# Patient Record
Sex: Female | Born: 1995 | Marital: Married | Smoking: Never smoker
Health system: Southern US, Community
[De-identification: ages and names within clinical notes are randomized; demographics above are authoritative.]

## PROBLEM LIST (undated history)

## (undated) DIAGNOSIS — O24419 Gestational diabetes mellitus in pregnancy, unspecified control: Secondary | ICD-10-CM

## (undated) HISTORY — PX: NO PAST SURGERIES: SHX2092

## (undated) HISTORY — DX: Gestational diabetes mellitus in pregnancy, unspecified control: O24.419

---

## 2021-03-13 NOTE — L&D Delivery Note (Signed)
OB/GYN Faculty Practice Delivery Note  Sheryl Lopez is a 26 y.o. G2P1001 s/p SVD at [redacted]w[redacted]d. She was admitted for SOL.   ROM: 1h 42m with clear fluid GBS Status: negative Maximum Maternal Temperature: 98.5  Labor Progress: Presented for contractions, found to be 8 cm, she then [facility] and progressed to complete  Delivery Date/Time: 0938 on 6/14 Delivery: Called to room and patient was complete and pushing. Head delivered LOA. No nuchal cord present. Shoulder and body delivered in usual fashion. Infant with spontaneous cry, placed on mother's abdomen, dried and stimulated. Cord clamped x 2 after stopped pulsing (per family preference), and cut by father of baby . Cord blood drawn. Placenta delivered spontaneously with gentle cord traction. Fundus firm with massage and Pitocin. Labia, perineum, vagina, and cervix inspected inspected and found to have a second degree laceration that was repaired with 3-0 vicryl. She also had a mild periurethral abrasion that was hemostatic and therefore was not repaired.  Placenta: intact, 3V cord, to L&D Complications: none Lacerations: 2nd degree EBL: 150cc Analgesia: IV fentanyl and local lidocaine for repair  Infant: female  APGARs 9,9  weight pending  Warner Mccreedy, MD, MPH OB Fellow, Faculty Practice Center for [employer], [facility]

## 2021-05-18 ENCOUNTER — Other Ambulatory Visit: Payer: Self-pay

## 2021-05-18 ENCOUNTER — Ambulatory Visit (INDEPENDENT_AMBULATORY_CARE_PROVIDER_SITE_OTHER): Payer: Medicaid Other | Admitting: Obstetrics and Gynecology

## 2021-05-18 ENCOUNTER — Encounter: Payer: Self-pay | Admitting: Obstetrics and Gynecology

## 2021-05-18 ENCOUNTER — Other Ambulatory Visit (HOSPITAL_COMMUNITY)
Admission: RE | Admit: 2021-05-18 | Discharge: 2021-05-18 | Disposition: A | Discharge status: Home / Self Care | Payer: Medicaid Other | Source: Ambulatory Visit | Attending: Obstetrics and Gynecology | Admitting: Obstetrics and Gynecology

## 2021-05-18 DIAGNOSIS — Z3480 Encounter for supervision of other normal pregnancy, unspecified trimester: Secondary | ICD-10-CM | POA: Insufficient documentation

## 2021-05-18 DIAGNOSIS — Z8632 Personal history of gestational diabetes: Secondary | ICD-10-CM

## 2021-05-18 DIAGNOSIS — O09299 Supervision of pregnancy with other poor reproductive or obstetric history, unspecified trimester: Secondary | ICD-10-CM | POA: Insufficient documentation

## 2021-05-18 DIAGNOSIS — Z3400 Encounter for supervision of normal first pregnancy, unspecified trimester: Secondary | ICD-10-CM

## 2021-05-18 MED ORDER — BLOOD PRESSURE KIT DEVI: 1.0000 | 0 refills | Status: DC

## 2021-05-18 NOTE — Progress Notes (Signed)
Subjective:  ?Sheryl Lopez is a 26 y.o. G2P1001 at [redacted]w[redacted]d being seen today for her first OB visit. New to [place]. EDD by certain LMP. H/O TSVD without problems. Denies any chronic medical problems or medications.    She is currently monitored for the following issues for this low-risk pregnancy and has Supervision of other normal pregnancy, antepartum and History of gestational diabetes on their problem list. ? ?Patient reports no complaints.  Contractions: Not present. Vag. Bleeding: None.  Movement: Present. Denies leaking of fluid.  ? ?The following portions of the patient's history were reviewed and updated as appropriate: allergies, current medications, past family history, past medical history, past social history, past surgical history and problem list. Problem list updated. ? ?Objective:  ? ?Vitals:  ? 05/18/21 1503 05/18/21 1504  ?BP: 130/86   ?Pulse: (!) 102   ?Weight: 155 lb (70.3 kg)   ?Height:  5\' 5"  (1.651 m)  ? ? ?Fetal Status: Fetal Heart Rate (bpm): 159   Movement: Present    ? ?General:  Alert, oriented and cooperative. Patient is in no acute distress.  ?Skin: Skin is warm and dry. No rash noted.   ?Cardiovascular: Normal heart rate noted  ?Respiratory: Normal respiratory effort, no problems with respiration noted  ?Abdomen: Soft, gravid, appropriate for gestational age. Pain/Pressure: Absent     ?Pelvic:  Cervical exam deferred        ?Extremities: Normal range of motion.  Edema: None  ?Mental Status: Normal mood and affect. Normal behavior. Normal judgment and thought content.  ? ?Urinalysis:     ? ?Assessment and Plan:  ?Pregnancy: G2P1001 at [redacted]w[redacted]d ? ?1. Supervision of other normal pregnancy, antepartum ?Prenatal labs and care reviewed with pt ?- CBC/D/Plt+RPR+Rh+ABO+RubIgG... ?- Culture, OB Urine ?- Cervicovaginal ancillary only( [place]) ?- HgB A1c ?- [redacted]w[redacted]d MFM OB COMP + 14 WK; Future ?Reports normal pap smear in 2021, records requested ? ?2. History of gestational diabetes ?A1c today ?Glucola  next visit ? ?Preterm labor symptoms and general obstetric precautions including but not limited to vaginal bleeding, contractions, leaking of fluid and fetal movement were reviewed in detail with the patient. ?Please refer to After Visit Summary for other counseling recommendations.  ?Return in about 2 weeks (around 06/01/2021) for OB visit, face to face, any provider, fasing for Glucola. ? ? ?06/03/2021, MD ?

## 2021-05-18 NOTE — Progress Notes (Addendum)
NOB, Pt declined FLU vaccine and Genetic screening. Late to care. ?Last PAP 2021 ?

## 2021-05-18 NOTE — Patient Instructions (Signed)

## 2021-05-19 LAB — CERVICOVAGINAL ANCILLARY ONLY
Chlamydia: NEGATIVE
Comment: NEGATIVE
Comment: NORMAL
Neisseria Gonorrhea: NEGATIVE

## 2021-05-19 LAB — CBC/D/PLT+RPR+RH+ABO+RUBIGG...
Antibody Screen: NEGATIVE
Basophils Absolute: 0 10*3/uL (ref 0.0–0.2)
Basos: 0 %
EOS (ABSOLUTE): 0 10*3/uL (ref 0.0–0.4)
Eos: 0 %
HCV Ab: NONREACTIVE
HIV Screen 4th Generation wRfx: NONREACTIVE
Hematocrit: 34.3 % (ref 34.0–46.6)
Hemoglobin: 11.7 g/dL (ref 11.1–15.9)
Hepatitis B Surface Ag: NEGATIVE
Immature Grans (Abs): 0 10*3/uL (ref 0.0–0.1)
Immature Granulocytes: 0 %
Lymphocytes Absolute: 1.7 10*3/uL (ref 0.7–3.1)
Lymphs: 19 %
MCH: 32 pg (ref 26.6–33.0)
MCHC: 34.1 g/dL (ref 31.5–35.7)
MCV: 94 fL (ref 79–97)
Monocytes Absolute: 0.5 10*3/uL (ref 0.1–0.9)
Monocytes: 6 %
Neutrophils Absolute: 6.4 10*3/uL (ref 1.4–7.0)
Neutrophils: 75 %
Platelets: 220 10*3/uL (ref 150–450)
RBC: 3.66 x10E6/uL — ABNORMAL LOW (ref 3.77–5.28)
RDW: 11.7 % (ref 11.7–15.4)
RPR Ser Ql: NONREACTIVE
Rh Factor: NEGATIVE
Rubella Antibodies, IGG: 6.83 index (ref >0.99)
WBC: 8.7 10*3/uL (ref 3.4–10.8)

## 2021-05-19 LAB — HEMOGLOBIN A1C
Est. average glucose Bld gHb Est-mCnc: 91 mg/dL
Hgb A1c MFr Bld: 4.8 % (ref 4.8–5.6)

## 2021-05-19 LAB — HCV INTERPRETATION

## 2021-05-20 LAB — CULTURE, OB URINE

## 2021-05-20 LAB — URINE CULTURE, OB REFLEX

## 2021-06-01 ENCOUNTER — Other Ambulatory Visit: Payer: Medicaid Other

## 2021-06-01 ENCOUNTER — Other Ambulatory Visit: Payer: Self-pay

## 2021-06-01 ENCOUNTER — Ambulatory Visit (INDEPENDENT_AMBULATORY_CARE_PROVIDER_SITE_OTHER): Payer: Medicaid Other | Admitting: Obstetrics and Gynecology

## 2021-06-01 VITALS — BP 115/72 | HR 100 | Wt 157.0 lb

## 2021-06-01 DIAGNOSIS — Z6791 Unspecified blood type, Rh negative: Secondary | ICD-10-CM

## 2021-06-01 DIAGNOSIS — O360920 Maternal care for other rhesus isoimmunization, second trimester, not applicable or unspecified: Secondary | ICD-10-CM

## 2021-06-01 DIAGNOSIS — Z3480 Encounter for supervision of other normal pregnancy, unspecified trimester: Secondary | ICD-10-CM

## 2021-06-01 DIAGNOSIS — Z3A27 27 weeks gestation of pregnancy: Secondary | ICD-10-CM | POA: Diagnosis not present

## 2021-06-01 DIAGNOSIS — O26899 Other specified pregnancy related conditions, unspecified trimester: Secondary | ICD-10-CM

## 2021-06-01 DIAGNOSIS — O26892 Other specified pregnancy related conditions, second trimester: Secondary | ICD-10-CM

## 2021-06-01 MED ORDER — RHO D IMMUNE GLOBULIN 1500 UNIT/2ML IJ SOSY
300.0000 ug | PREFILLED_SYRINGE | Freq: Once | INTRAMUSCULAR | Status: AC
Start: 2021-06-01 — End: 2021-06-01
  Administered 2021-06-01: 300 ug via INTRAMUSCULAR

## 2021-06-01 NOTE — Progress Notes (Addendum)
ROB/GTT. Declined TDAP vaccine. ? ?Administrations This Visit   ? ? rho (d) immune globulin (RHIG/RHOPHYLAC) injection 300 mcg   ? ? Admin Date ?06/01/2021 Action ?Given Dose ?300 mcg Route ?Intramuscular Administered By ?Maretta Bees, RMA  ? ?  ?  ? ?  ?  ?

## 2021-06-01 NOTE — Progress Notes (Signed)
? ?  PRENATAL VISIT NOTE ? ?Subjective:  ?Sheryl Lopez is a 26 y.o. G2P1001 at [redacted]w[redacted]d being seen today for ongoing prenatal care.  She is currently monitored for the following issues for this low-risk pregnancy and has Supervision of other normal pregnancy, antepartum; History of gestational diabetes; and Rh negative, antepartum on their problem list. ? ?Patient reports no complaints.  Contractions: Not present. Vag. Bleeding: None.  Movement: Present. Denies leaking of fluid.  ? ?The following portions of the patient's history were reviewed and updated as appropriate: allergies, current medications, past family history, past medical history, past social history, past surgical history and problem list.  ? ?Objective:  ? ?Vitals:  ? 06/01/21 0827  ?BP: 115/72  ?Pulse: 100  ?Weight: 157 lb (71.2 kg)  ? ? ?Fetal Status: Fetal Heart Rate (bpm): 145   Movement: Present    ? ?General:  Alert, oriented and cooperative. Patient is in no acute distress.  ?Skin: Skin is warm and dry. No rash noted.   ?Cardiovascular: Normal heart rate noted  ?Respiratory: Normal respiratory effort, no problems with respiration noted  ?Abdomen: Soft, gravid, appropriate for gestational age.  Pain/Pressure: Absent     ?Pelvic: Cervical exam deferred        ?Extremities: Normal range of motion.  Edema: None  ?Mental Status: Normal mood and affect. Normal behavior. Normal judgment and thought content.  ? ?Assessment and Plan:  ?Pregnancy: G2P1001 at [redacted]w[redacted]d ? ?1. Supervision of other normal pregnancy, antepartum ? ?- Glucose Tolerance, 2 Hours w/1 Hour ?- CBC ?- HIV Antibody (routine testing w rflx) ?- RPR ?- Patient reports negative pap in the last 1-2 years. Records requested again today.  ? ?2. Rh negative, antepartum ? ?- Rhogam given today  ? ?Preterm labor symptoms and general obstetric precautions including but not limited to vaginal bleeding, contractions, leaking of fluid and fetal movement were reviewed in detail with the patient. ?Please  refer to After Visit Summary for other counseling recommendations.  ? ?Return 2-3 weeks, for Please have her sign another request for records. . ? ?Future Appointments  ?Date Time Provider Department Center  ?06/01/2021 10:35 AM Alfonso Shackett, Harolyn Rutherford, NP [facility] None  ?06/08/2021  7:45 AM [facility] US6 [facility]  ? ? ?Venia Carbon, NP  ?

## 2021-06-02 LAB — HIV ANTIBODY (ROUTINE TESTING W REFLEX): HIV Screen 4th Generation wRfx: NONREACTIVE

## 2021-06-02 LAB — CBC
Hematocrit: 34.6 % (ref 34.0–46.6)
Hemoglobin: 11.8 g/dL (ref 11.1–15.9)
MCH: 31.8 pg (ref 26.6–33.0)
MCHC: 34.1 g/dL (ref 31.5–35.7)
MCV: 93 fL (ref 79–97)
Platelets: 233 10*3/uL (ref 150–450)
RBC: 3.71 x10E6/uL — ABNORMAL LOW (ref 3.77–5.28)
RDW: 11.7 % (ref 11.7–15.4)
WBC: 7.6 10*3/uL (ref 3.4–10.8)

## 2021-06-02 LAB — RPR: RPR Ser Ql: NONREACTIVE

## 2021-06-02 LAB — GLUCOSE TOLERANCE, 2 HOURS W/ 1HR
Glucose, 1 hour: 108 mg/dL (ref 70–179)
Glucose, 2 hour: 86 mg/dL (ref 70–152)
Glucose, Fasting: 75 mg/dL (ref 70–91)

## 2021-06-03 ENCOUNTER — Telehealth: Payer: Self-pay

## 2021-06-08 ENCOUNTER — Ambulatory Visit: Payer: Medicaid Other | Admitting: *Deleted

## 2021-06-08 ENCOUNTER — Encounter: Payer: Self-pay | Admitting: *Deleted

## 2021-06-08 ENCOUNTER — Ambulatory Visit: Payer: Medicaid Other | Attending: Obstetrics and Gynecology

## 2021-06-08 ENCOUNTER — Other Ambulatory Visit: Payer: Self-pay | Admitting: *Deleted

## 2021-06-08 ENCOUNTER — Other Ambulatory Visit: Payer: Self-pay

## 2021-06-08 VITALS — BP 122/65 | HR 90

## 2021-06-08 DIAGNOSIS — O0933 Supervision of pregnancy with insufficient antenatal care, third trimester: Secondary | ICD-10-CM | POA: Diagnosis not present

## 2021-06-08 DIAGNOSIS — Z3480 Encounter for supervision of other normal pregnancy, unspecified trimester: Secondary | ICD-10-CM | POA: Diagnosis not present

## 2021-06-08 DIAGNOSIS — Z3A28 28 weeks gestation of pregnancy: Secondary | ICD-10-CM | POA: Diagnosis not present

## 2021-06-08 DIAGNOSIS — Z363 Encounter for antenatal screening for malformations: Secondary | ICD-10-CM | POA: Diagnosis present

## 2021-06-13 ENCOUNTER — Ambulatory Visit: Payer: Medicaid Other

## 2021-06-22 ENCOUNTER — Encounter: Payer: Self-pay | Admitting: Obstetrics and Gynecology

## 2021-06-22 ENCOUNTER — Ambulatory Visit (INDEPENDENT_AMBULATORY_CARE_PROVIDER_SITE_OTHER): Payer: Medicaid Other | Admitting: Obstetrics and Gynecology

## 2021-06-22 VITALS — BP 117/68 | HR 78 | Wt 159.2 lb

## 2021-06-22 DIAGNOSIS — O26899 Other specified pregnancy related conditions, unspecified trimester: Secondary | ICD-10-CM

## 2021-06-22 DIAGNOSIS — Z3480 Encounter for supervision of other normal pregnancy, unspecified trimester: Secondary | ICD-10-CM

## 2021-06-22 DIAGNOSIS — Z6791 Unspecified blood type, Rh negative: Secondary | ICD-10-CM

## 2021-06-22 DIAGNOSIS — Z8632 Personal history of gestational diabetes: Secondary | ICD-10-CM

## 2021-06-22 NOTE — Progress Notes (Signed)
? ?  PRENATAL VISIT NOTE ? ?Subjective:  ?Sheryl Lopez is a 26 y.o. G2P1001 at [redacted]w[redacted]d being seen today for ongoing prenatal care.  She is currently monitored for the following issues for this low-risk pregnancy and has Supervision of other normal pregnancy, antepartum; History of gestational diabetes; and Rh negative, antepartum on their problem list. ? ?Patient reports no complaints.  Contractions: Not present. Vag. Bleeding: None.  Movement: Present. Denies leaking of fluid.  ? ?The following portions of the patient's history were reviewed and updated as appropriate: allergies, current medications, past family history, past medical history, past social history, past surgical history and problem list.  ? ?Objective:  ? ?Vitals:  ? 06/22/21 0958  ?BP: 117/68  ?Pulse: 78  ?Weight: 159 lb 3.2 oz (72.2 kg)  ? ? ?Fetal Status: Fetal Heart Rate (bpm): 153   Movement: Present    ? ?General:  Alert, oriented and cooperative. Patient is in no acute distress.  ?Skin: Skin is warm and dry. No rash noted.   ?Cardiovascular: Normal heart rate noted  ?Respiratory: Normal respiratory effort, no problems with respiration noted  ?Abdomen: Soft, gravid, appropriate for gestational age.  Pain/Pressure: Absent     ?Pelvic: Cervical exam deferred        ?Extremities: Normal range of motion.  Edema: None  ?Mental Status: Normal mood and affect. Normal behavior. Normal judgment and thought content.  ? ?Assessment and Plan:  ?Pregnancy: G2P1001 at [redacted]w[redacted]d ?1. Supervision of other normal pregnancy, antepartum ?- Follow-up in 2 weeks ? ?2. Rh negative, antepartum ?- Received rhogam previous visit ? ?3. History of gestational diabetes ?- Glucose tolerance performed previous visit - Glucose within normal limits  ? ?Preterm labor symptoms and general obstetric precautions including but not limited to vaginal bleeding, contractions, leaking of fluid and fetal movement were reviewed in detail with the patient. ?Please refer to After Visit Summary  for other counseling recommendations.  ? ?Return in about 2 weeks (around 07/06/2021) for OB visit, face to face, any provider. ? ?Future Appointments  ?Date Time Provider Department Center  ?07/06/2021  8:55 AM Brock Bad, MD [facility] None  ?07/07/2021  9:15 AM [facility] NURSE [facility]  ?07/07/2021  9:30 AM [facility] US2 [facility]  ? ? ?Carissa Musick, Medical Student  ?

## 2021-06-22 NOTE — Progress Notes (Signed)
Subjective:  ?Sheryl Lopez is a 26 y.o. G2P1001 at [redacted]w[redacted]d being seen today for ongoing prenatal care.  She is currently monitored for the following issues for this low-risk pregnancy and has Supervision of other normal pregnancy, antepartum; History of gestational diabetes; and Rh negative, antepartum on their problem list. ? ?Patient reports no complaints.  Contractions: Not present. Vag. Bleeding: None.  Movement: Present. Denies leaking of fluid.  ? ?The following portions of the patient's history were reviewed and updated as appropriate: allergies, current medications, past family history, past medical history, past social history, past surgical history and problem list. Problem list updated. ? ?Objective:  ? ?Vitals:  ? 06/22/21 0958  ?BP: 117/68  ?Pulse: 78  ?Weight: 159 lb 3.2 oz (72.2 kg)  ? ? ?Fetal Status: Fetal Heart Rate (bpm): 153   Movement: Present    ? ?General:  Alert, oriented and cooperative. Patient is in no acute distress.  ?Skin: Skin is warm and dry. No rash noted.   ?Cardiovascular: Normal heart rate noted  ?Respiratory: Normal respiratory effort, no problems with respiration noted  ?Abdomen: Soft, gravid, appropriate for gestational age. Pain/Pressure: Absent     ?Pelvic:  Cervical exam deferred        ?Extremities: Normal range of motion.  Edema: None  ?Mental Status: Normal mood and affect. Normal behavior. Normal judgment and thought content.  ? ?Urinalysis:     ? ?Assessment and Plan:  ?Pregnancy: G2P1001 at [redacted]w[redacted]d ? ?1. Supervision of other normal pregnancy, antepartum ?Stable ? ?2. Rh negative, antepartum ?S/P Rhogam ? ?3. History of gestational diabetes ?Normal Glucola ? ?Preterm labor symptoms and general obstetric precautions including but not limited to vaginal bleeding, contractions, leaking of fluid and fetal movement were reviewed in detail with the patient. ?Please refer to After Visit Summary for other counseling recommendations.  ?Return in about 2 weeks (around 07/06/2021) for  OB visit, face to face, any provider. ? ? ?Chancy Milroy, MD ?

## 2021-06-22 NOTE — Patient Instructions (Signed)

## 2021-07-06 ENCOUNTER — Encounter: Payer: Self-pay | Admitting: Obstetrics

## 2021-07-06 ENCOUNTER — Ambulatory Visit (INDEPENDENT_AMBULATORY_CARE_PROVIDER_SITE_OTHER): Payer: Medicaid Other | Admitting: Obstetrics

## 2021-07-06 VITALS — BP 126/64 | HR 113 | Wt 159.4 lb

## 2021-07-06 DIAGNOSIS — Z3A32 32 weeks gestation of pregnancy: Secondary | ICD-10-CM

## 2021-07-06 DIAGNOSIS — Z3480 Encounter for supervision of other normal pregnancy, unspecified trimester: Secondary | ICD-10-CM

## 2021-07-06 NOTE — Progress Notes (Signed)
Subjective:  ?Sheryl Lopez is a 26 y.o. G2P1001 at [redacted]w[redacted]d being seen today for ongoing prenatal care.  She is currently monitored for the following issues for this low-risk pregnancy and has Supervision of other normal pregnancy, antepartum; History of gestational diabetes; and Rh negative, antepartum on their problem list. ? ?Patient reports no complaints.  Contractions: Not present. Vag. Bleeding: None.  Movement: Present. Denies leaking of fluid.  ? ?The following portions of the patient's history were reviewed and updated as appropriate: allergies, current medications, past family history, past medical history, past social history, past surgical history and problem list. Problem list updated. ? ?Objective:  ? ?Vitals:  ? 07/06/21 0917  ?BP: 126/64  ?Pulse: (!) 113  ?Weight: 159 lb 6.4 oz (72.3 kg)  ? ? ?Fetal Status:     Movement: Present    ? ?General:  Alert, oriented and cooperative. Patient is in no acute distress.  ?Skin: Skin is warm and dry. No rash noted.   ?Cardiovascular: Normal heart rate noted  ?Respiratory: Normal respiratory effort, no problems with respiration noted  ?Abdomen: Soft, gravid, appropriate for gestational age. Pain/Pressure: Absent     ?Pelvic:  Cervical exam deferred        ?Extremities: Normal range of motion.  Edema: None  ?Mental Status: Normal mood and affect. Normal behavior. Normal judgment and thought content.  ? ?Urinalysis:     ? ?Assessment and Plan:  ?Pregnancy: G2P1001 at [redacted]w[redacted]d ? ?1. Supervision of other normal pregnancy, antepartum ? ? ?Preterm labor symptoms and general obstetric precautions including but not limited to vaginal bleeding, contractions, leaking of fluid and fetal movement were reviewed in detail with the patient. ?Please refer to After Visit Summary for other counseling recommendations.  ? ?Return in about 2 weeks (around 07/20/2021) for ROB. ? ? ?Brock Bad, MD  ?07/06/21  ?

## 2021-07-07 ENCOUNTER — Other Ambulatory Visit: Payer: Self-pay | Admitting: *Deleted

## 2021-07-07 ENCOUNTER — Ambulatory Visit: Payer: Medicaid Other | Attending: Obstetrics

## 2021-07-07 ENCOUNTER — Encounter: Payer: Self-pay | Admitting: *Deleted

## 2021-07-07 ENCOUNTER — Ambulatory Visit: Payer: Medicaid Other | Admitting: *Deleted

## 2021-07-07 VITALS — BP 115/72 | HR 88

## 2021-07-07 DIAGNOSIS — Z3A33 33 weeks gestation of pregnancy: Secondary | ICD-10-CM

## 2021-07-07 DIAGNOSIS — Z363 Encounter for antenatal screening for malformations: Secondary | ICD-10-CM | POA: Diagnosis not present

## 2021-07-07 DIAGNOSIS — Z362 Encounter for other antenatal screening follow-up: Secondary | ICD-10-CM

## 2021-07-07 DIAGNOSIS — Z3480 Encounter for supervision of other normal pregnancy, unspecified trimester: Secondary | ICD-10-CM

## 2021-07-07 DIAGNOSIS — O0933 Supervision of pregnancy with insufficient antenatal care, third trimester: Secondary | ICD-10-CM | POA: Diagnosis not present

## 2021-07-22 ENCOUNTER — Encounter: Payer: Self-pay | Admitting: Obstetrics and Gynecology

## 2021-07-22 ENCOUNTER — Ambulatory Visit (INDEPENDENT_AMBULATORY_CARE_PROVIDER_SITE_OTHER): Payer: Medicaid Other | Admitting: Obstetrics and Gynecology

## 2021-07-22 VITALS — BP 119/68 | HR 116 | Wt 165.0 lb

## 2021-07-22 DIAGNOSIS — O26899 Other specified pregnancy related conditions, unspecified trimester: Secondary | ICD-10-CM

## 2021-07-22 DIAGNOSIS — Z6791 Unspecified blood type, Rh negative: Secondary | ICD-10-CM

## 2021-07-22 DIAGNOSIS — Z3480 Encounter for supervision of other normal pregnancy, unspecified trimester: Secondary | ICD-10-CM

## 2021-07-22 NOTE — Patient Instructions (Signed)

## 2021-07-22 NOTE — Progress Notes (Signed)
Subjective:  ?Sheryl Lopez is a 26 y.o. G2P1001 at [redacted]w[redacted]d being seen today for ongoing prenatal care.  She is currently monitored for the following issues for this low-risk pregnancy and has Supervision of other normal pregnancy, antepartum; History of gestational diabetes; and Rh negative, antepartum on their problem list. ? ?Patient reports general discomforts of pregnancy.  Contractions: Not present. Vag. Bleeding: None.  Movement: Present. Denies leaking of fluid.  ? ?The following portions of the patient's history were reviewed and updated as appropriate: allergies, current medications, past family history, past medical history, past social history, past surgical history and problem list. Problem list updated. ? ?Objective:  ? ?Vitals:  ? 07/22/21 0907  ?BP: 119/68  ?Pulse: (!) 116  ?Weight: 165 lb (74.8 kg)  ? ? ?Fetal Status: Fetal Heart Rate (bpm): 160   Movement: Present    ? ?General:  Alert, oriented and cooperative. Patient is in no acute distress.  ?Skin: Skin is warm and dry. No rash noted.   ?Cardiovascular: Normal heart rate noted  ?Respiratory: Normal respiratory effort, no problems with respiration noted  ?Abdomen: Soft, gravid, appropriate for gestational age. Pain/Pressure: Absent     ?Pelvic:  Cervical exam deferred        ?Extremities: Normal range of motion.  Edema: Trace  ?Mental Status: Normal mood and affect. Normal behavior. Normal judgment and thought content.  ? ?Urinalysis:     ? ?Assessment and Plan:  ?Pregnancy: G2P1001 at [redacted]w[redacted]d ? ?1. Supervision of other normal pregnancy, antepartum ?Stable ?GBS and vaginal cultures next visit ?Growth scan in 2 weeks ? ?2. Rh negative, antepartum ?S/P Rhogam ? ?Preterm labor symptoms and general obstetric precautions including but not limited to vaginal bleeding, contractions, leaking of fluid and fetal movement were reviewed in detail with the patient. ?Please refer to After Visit Summary for other counseling recommendations.  ?Return in about 1 week  (around 07/29/2021) for OB visit, face to face, any provider. ? ? ?Hermina Staggers, MD ?

## 2021-07-28 ENCOUNTER — Encounter: Payer: Medicaid Other | Admitting: Obstetrics and Gynecology

## 2021-08-05 ENCOUNTER — Ambulatory Visit: Payer: Medicaid Other | Admitting: *Deleted

## 2021-08-05 ENCOUNTER — Ambulatory Visit: Payer: Medicaid Other | Attending: Obstetrics and Gynecology

## 2021-08-05 ENCOUNTER — Other Ambulatory Visit: Payer: Self-pay | Admitting: Obstetrics and Gynecology

## 2021-08-05 VITALS — BP 113/69 | HR 86

## 2021-08-05 DIAGNOSIS — O0933 Supervision of pregnancy with insufficient antenatal care, third trimester: Secondary | ICD-10-CM | POA: Insufficient documentation

## 2021-08-05 DIAGNOSIS — Z3480 Encounter for supervision of other normal pregnancy, unspecified trimester: Secondary | ICD-10-CM | POA: Insufficient documentation

## 2021-08-05 DIAGNOSIS — Z362 Encounter for other antenatal screening follow-up: Secondary | ICD-10-CM

## 2021-08-05 DIAGNOSIS — O368330 Maternal care for abnormalities of the fetal heart rate or rhythm, third trimester, not applicable or unspecified: Secondary | ICD-10-CM | POA: Insufficient documentation

## 2021-08-05 DIAGNOSIS — O09293 Supervision of pregnancy with other poor reproductive or obstetric history, third trimester: Secondary | ICD-10-CM

## 2021-08-05 DIAGNOSIS — Z3A37 37 weeks gestation of pregnancy: Secondary | ICD-10-CM | POA: Diagnosis not present

## 2021-08-05 NOTE — Procedures (Signed)
Sheryl Lopez 27-May-1995 [redacted]w[redacted]d  Fetus A Non-Stress Test Interpretation for 08/05/21  Indication:  fetal tachycardia  Fetal Heart Rate A Mode: External Baseline Rate (A): 145 bpm Variability: Moderate Accelerations: 15 x 15 Decelerations: None Multiple birth?: No  Uterine Activity Mode: Toco Contraction Frequency (min): none Resting Tone Palpated: Relaxed  Interpretation (Fetal Testing) Nonstress Test Interpretation: Reactive Overall Impression: Reassuring for gestational age Comments: tracing reviewed by Dr.Shankar

## 2021-08-23 ENCOUNTER — Other Ambulatory Visit: Payer: Self-pay

## 2021-08-23 ENCOUNTER — Inpatient Hospital Stay (HOSPITAL_COMMUNITY)
Admission: AD | Admit: 2021-08-23 | Discharge: 2021-08-25 | DRG: 807 | Disposition: A | Discharge status: Home / Self Care | Payer: Medicaid Other | Attending: Obstetrics & Gynecology | Admitting: Obstetrics & Gynecology

## 2021-08-23 DIAGNOSIS — Z3A39 39 weeks gestation of pregnancy: Secondary | ICD-10-CM

## 2021-08-23 DIAGNOSIS — O26893 Other specified pregnancy related conditions, third trimester: Principal | ICD-10-CM | POA: Diagnosis present

## 2021-08-23 DIAGNOSIS — Z6791 Unspecified blood type, Rh negative: Secondary | ICD-10-CM

## 2021-08-23 DIAGNOSIS — O26899 Other specified pregnancy related conditions, unspecified trimester: Secondary | ICD-10-CM

## 2021-08-23 DIAGNOSIS — Z3480 Encounter for supervision of other normal pregnancy, unspecified trimester: Principal | ICD-10-CM

## 2021-08-24 ENCOUNTER — Encounter (HOSPITAL_COMMUNITY): Payer: Self-pay | Admitting: Obstetrics & Gynecology

## 2021-08-24 DIAGNOSIS — Z6791 Unspecified blood type, Rh negative: Secondary | ICD-10-CM | POA: Diagnosis not present

## 2021-08-24 DIAGNOSIS — Z3A39 39 weeks gestation of pregnancy: Secondary | ICD-10-CM | POA: Diagnosis not present

## 2021-08-24 DIAGNOSIS — O26893 Other specified pregnancy related conditions, third trimester: Secondary | ICD-10-CM | POA: Diagnosis present

## 2021-08-24 LAB — CBC
HCT: 30.3 % — ABNORMAL LOW (ref 36.0–46.0)
HCT: 30.9 % — ABNORMAL LOW (ref 36.0–46.0)
Hemoglobin: 10.1 g/dL — ABNORMAL LOW (ref 12.0–15.0)
Hemoglobin: 10.1 g/dL — ABNORMAL LOW (ref 12.0–15.0)
MCH: 28.8 pg (ref 26.0–34.0)
MCH: 29.2 pg (ref 26.0–34.0)
MCHC: 32.7 g/dL (ref 30.0–36.0)
MCHC: 33.3 g/dL (ref 30.0–36.0)
MCV: 87.6 fL (ref 80.0–100.0)
MCV: 88 fL (ref 80.0–100.0)
Platelets: 208 10*3/uL (ref 150–400)
Platelets: 218 10*3/uL (ref 150–400)
RBC: 3.46 MIL/uL — ABNORMAL LOW (ref 3.87–5.11)
RBC: 3.51 MIL/uL — ABNORMAL LOW (ref 3.87–5.11)
RDW: 13.3 % (ref 11.5–15.5)
RDW: 13.5 % (ref 11.5–15.5)
WBC: 15.2 10*3/uL — ABNORMAL HIGH (ref 4.0–10.5)
WBC: 9.8 10*3/uL (ref 4.0–10.5)
nRBC: 0 % (ref 0.0–0.2)
nRBC: 0 % (ref 0.0–0.2)

## 2021-08-24 LAB — GROUP B STREP BY PCR: Group B strep by PCR: NEGATIVE

## 2021-08-24 LAB — RPR: RPR Ser Ql: NONREACTIVE

## 2021-08-24 LAB — TYPE AND SCREEN
ABO/RH(D): O NEG
Antibody Screen: POSITIVE

## 2021-08-24 MED ORDER — ONDANSETRON HCL 4 MG/2ML IJ SOLN: 4.0000 mg | INTRAMUSCULAR | Status: DC | PRN

## 2021-08-24 MED ORDER — LIDOCAINE HCL (PF) 1 % IJ SOLN
30.0000 mL | INTRAMUSCULAR | Status: AC | PRN
  Administered 2021-08-24: 30 mL via SUBCUTANEOUS
  Filled 2021-08-24: qty 30

## 2021-08-24 MED ORDER — TETANUS-DIPHTH-ACELL PERTUSSIS 5-2.5-18.5 LF-MCG/0.5 IM SUSY: 0.5000 mL | PREFILLED_SYRINGE | Freq: Once | INTRAMUSCULAR | Status: DC

## 2021-08-24 MED ORDER — FENTANYL CITRATE (PF) 100 MCG/2ML IJ SOLN
INTRAMUSCULAR | Status: AC
  Filled 2021-08-24: qty 2

## 2021-08-24 MED ORDER — FENTANYL CITRATE (PF) 100 MCG/2ML IJ SOLN
50.0000 ug | Freq: Once | INTRAMUSCULAR | Status: AC
  Administered 2021-08-24: 50 ug via INTRAVENOUS

## 2021-08-24 MED ORDER — OXYCODONE-ACETAMINOPHEN 5-325 MG PO TABS: 2.0000 | ORAL_TABLET | ORAL | Status: DC | PRN

## 2021-08-24 MED ORDER — ACETAMINOPHEN 325 MG PO TABS: 650.0000 mg | ORAL_TABLET | ORAL | Status: DC | PRN

## 2021-08-24 MED ORDER — PRENATAL MULTIVITAMIN CH
1.0000 | ORAL_TABLET | Freq: Every day | ORAL | Status: DC
  Filled 2021-08-24: qty 1

## 2021-08-24 MED ORDER — OXYCODONE-ACETAMINOPHEN 5-325 MG PO TABS: 1.0000 | ORAL_TABLET | ORAL | Status: DC | PRN

## 2021-08-24 MED ORDER — ONDANSETRON HCL 4 MG PO TABS: 4.0000 mg | ORAL_TABLET | ORAL | Status: DC | PRN

## 2021-08-24 MED ORDER — SOD CITRATE-CITRIC ACID 500-334 MG/5ML PO SOLN: 30.0000 mL | ORAL | Status: DC | PRN

## 2021-08-24 MED ORDER — SIMETHICONE 80 MG PO CHEW
80.0000 mg | CHEWABLE_TABLET | ORAL | Status: DC | PRN
  Filled 2021-08-24: qty 1

## 2021-08-24 MED ORDER — COCONUT OIL OIL
1.0000 "application " | TOPICAL_OIL | Status: DC | PRN
  Administered 2021-08-25: 1 via TOPICAL

## 2021-08-24 MED ORDER — RHO D IMMUNE GLOBULIN 1500 UNIT/2ML IJ SOSY
300.0000 ug | PREFILLED_SYRINGE | Freq: Once | INTRAMUSCULAR | Status: AC
  Administered 2021-08-24: 300 ug via INTRAVENOUS
  Filled 2021-08-24: qty 2

## 2021-08-24 MED ORDER — LACTATED RINGERS IV SOLN: 500.0000 mL | INTRAVENOUS | Status: DC | PRN

## 2021-08-24 MED ORDER — IBUPROFEN 100 MG/5ML PO SUSP
600.0000 mg | Freq: Four times a day (QID) | ORAL | Status: DC
  Administered 2021-08-24 – 2021-08-25 (×5): 600 mg via ORAL
  Filled 2021-08-24 (×5): qty 30

## 2021-08-24 MED ORDER — BENZOCAINE-MENTHOL 20-0.5 % EX AERO
1.0000 | INHALATION_SPRAY | CUTANEOUS | Status: DC | PRN
Start: 2021-08-24 — End: 2021-08-25
  Filled 2021-08-24: qty 56

## 2021-08-24 MED ORDER — DIPHENHYDRAMINE HCL 25 MG PO CAPS: 25.0000 mg | ORAL_CAPSULE | Freq: Four times a day (QID) | ORAL | Status: DC | PRN

## 2021-08-24 MED ORDER — ONDANSETRON HCL 4 MG/2ML IJ SOLN: 4.0000 mg | Freq: Four times a day (QID) | INTRAMUSCULAR | Status: DC | PRN

## 2021-08-24 MED ORDER — LACTATED RINGERS IV SOLN: INTRAVENOUS | Status: DC

## 2021-08-24 MED ORDER — SENNOSIDES-DOCUSATE SODIUM 8.6-50 MG PO TABS
2.0000 | ORAL_TABLET | Freq: Every day | ORAL | Status: DC
  Filled 2021-08-24: qty 2

## 2021-08-24 MED ORDER — DIBUCAINE (PERIANAL) 1 % EX OINT: 1.0000 "application " | TOPICAL_OINTMENT | CUTANEOUS | Status: DC | PRN

## 2021-08-24 MED ORDER — OXYTOCIN BOLUS FROM INFUSION
333.0000 mL | Freq: Once | INTRAVENOUS | Status: AC
  Administered 2021-08-24: 333 mL via INTRAVENOUS

## 2021-08-24 MED ORDER — MEASLES, MUMPS & RUBELLA VAC IJ SOLR: 0.5000 mL | Freq: Once | INTRAMUSCULAR | Status: DC

## 2021-08-24 MED ORDER — WITCH HAZEL-GLYCERIN EX PADS
1.0000 | MEDICATED_PAD | CUTANEOUS | Status: DC | PRN
Start: 2021-08-24 — End: 2021-08-25

## 2021-08-24 MED ORDER — MEDROXYPROGESTERONE ACETATE 150 MG/ML IM SUSP: 150.0000 mg | INTRAMUSCULAR | Status: DC | PRN

## 2021-08-24 MED ORDER — OXYTOCIN-SODIUM CHLORIDE 30-0.9 UT/500ML-% IV SOLN
2.5000 [IU]/h | INTRAVENOUS | Status: DC
  Filled 2021-08-24: qty 500

## 2021-08-24 MED ORDER — IBUPROFEN 600 MG PO TABS
600.0000 mg | ORAL_TABLET | Freq: Four times a day (QID) | ORAL | Status: DC
  Filled 2021-08-24: qty 1

## 2021-08-24 NOTE — Plan of Care (Signed)
Patient independent of self and baby care. Father of baby is involved and providing good support. Patient hopes that she and her baby are discharged to home tomorrow.

## 2021-08-24 NOTE — H&P (Signed)
OBSTETRIC ADMISSION HISTORY AND PHYSICAL  Sheryl Lopez is a 26 y.o. female G2P1001 with IUP at 49w6dby LMP presenting for SOL. She reports +FMs, No LOF, no VB, no blurry vision, headaches or peripheral edema, and RUQ pain.  She plans on breast feeding. She declines birth control. [facility] --->  Estimated Date of Delivery: 08/25/21  Sono:   _0 , CWD, normal anatomy, cephalic presentation, anterior placental lie, 3317g, 75% EFW   Prenatal History/Complications:  Rh negative - rhogam received 06/01/2021  Past Medical History: Past Medical History:  Diagnosis Date   Gestational diabetes     Past Surgical History: Past Surgical History:  Procedure Laterality Date   NO PAST SURGERIES      Obstetrical History: OB History     Gravida  2   Para  1   Term  1   Preterm      AB      Living  1      SAB      IAB      Ectopic      Multiple      Live Births  1           Social History Social History   Socioeconomic History   Marital status: Married    Spouse name: JMartinique  Number of children: 1   Years of education: Not on file   Highest education level: Not on file  Occupational History   Not on file  Tobacco Use   Smoking status: Never   Smokeless tobacco: Never  Vaping Use   Vaping Use: Never used  Substance and Sexual Activity   Alcohol use: Never   Drug use: Never   Sexual activity: Yes    Birth control/protection: None  Other Topics Concern   Not on file  Social History Narrative   Not on file   Social Determinants of Health   Financial Resource Strain: Not on file  Food Insecurity: Not on file  Transportation Needs: Not on file  Physical Activity: Not on file  Stress: Not on file  Social Connections: Not on file    Family History: Family History  Problem Relation Age of Onset   Cancer Maternal Grandmother    Diabetes Maternal Grandfather    Asthma Neg Hx    Heart disease Neg  Hx    Hypertension Neg Hx    Stroke Neg Hx     Allergies: No Known Allergies  Medications Prior to Admission  Medication Sig Dispense Refill Last Dose   Prenatal Vit-Fe Fumarate-FA (PRENATAL MULTIVITAMIN) TABS tablet Take 1 tablet by mouth daily at 12 noon.   08/23/2021   Blood Pressure Monitoring (BLOOD PRESSURE KIT) DEVI 1 kit by Does not apply route once a week. Check Blood Pressure regularly and record readings into the [employer] App.  Large Cuff.  DX O90.0 (Patient not taking: Reported on 07/06/2021) 1 each 0      Review of Systems   All systems reviewed and negative except as stated in HPI  Blood pressure 133/75, pulse 87, temperature 98.5 F (36.9 C), resp. rate 16, height _1  (1.651 m), weight 79.4 kg, last menstrual period 11/18/2020, SpO2 99 %. General appearance: alert Lungs: clear to auscultation bilaterally Heart: regular rate and rhythm Abdomen: soft, non-tender; bowel sounds normal Extremities: Homans sign is negative, no sign of DVT Presentation: cephalic Fetal monitoringBaseline: 135 bpm, Variability: Good {> 6 bpm), Accelerations:  no accels yet but only monitored for 10 min so far, and Decelerations: Absent Uterine activity every 3-5 Dilation: 8 Effacement (%): 90 Station: 0 Exam by:: Francee Gentile, RN   Prenatal labs: ABO, Rh: --/--/PENDING (06/14 0024) Antibody: PENDING (06/14 0024) Rubella: 6.83 (03/08 1527) RPR: Non Reactive (03/22 0945)  HBsAg: Negative (03/08 1527)  HIV: Non Reactive (03/22 0945)  GBS:   unknown (missed appt for swab) 2 hr Glucola normal Genetic screening  declined Anatomy US normal  Prenatal Transfer Tool  Maternal Diabetes: No Genetic Screening: Declined Maternal Ultrasounds/Referrals: Normal Fetal Ultrasounds or other Referrals:  None Maternal Substance Abuse:  No Significant Maternal Medications:  None Significant Maternal Lab Results: Other: GBS unk  Results for orders placed or performed during the hospital  encounter of 08/23/21 (from the past 24 hour(s))  CBC   Collection Time: 08/24/21 12:24 AM  Result Value Ref Range   WBC 9.8 4.0 - 10.5 K/uL   RBC 3.51 (L) 3.87 - 5.11 MIL/uL   Hemoglobin 10.1 (L) 12.0 - 15.0 g/dL   HCT 30.9 (L) 36.0 - 46.0 %   MCV 88.0 80.0 - 100.0 fL   MCH 28.8 26.0 - 34.0 pg   MCHC 32.7 30.0 - 36.0 g/dL   RDW 13.5 11.5 - 15.5 %   Platelets 218 150 - 400 K/uL   nRBC 0.0 0.0 - 0.2 %  Type and screen [place]   Collection Time: 08/24/21 12:24 AM  Result Value Ref Range   ABO/RH(D) PENDING    Antibody Screen PENDING    Sample Expiration      08/27/2021,2359 Performed at [facility], [address], [place], [place] [address]     Patient Active Problem List   Diagnosis Date Noted   Normal labor 08/24/2021   Rh negative, antepartum 06/01/2021   Supervision of other normal pregnancy, antepartum 05/18/2021   History of gestational diabetes 05/18/2021    Assessment/Plan:  Sheryl Lopez is a 26 y.o. G2P1001 at 50w6dhere for spontaneous labor  #Labor:Presented at 8cm.  Will plan for expectant management at this time. Offered AROM but patient would like to hold off #Pain: unmedicated #FWB: Cat I #ID:  GBS unk, PCR sent. Will hold off on treating given patient is term #MOF: breast #MOC: declines #Circ:  unsure  #Rh negative Received rhogam on 3/22. Plan for rhogam workup PP  ARenard Matter MD, MPH OB Fellow, Faculty Practice

## 2021-08-24 NOTE — Discharge Summary (Signed)
Postpartum Discharge Summary     Patient Name: Sheryl Lopez DOB: [birth year] MRN: [id number]  Date of admission: 08/23/2021 Delivery date:08/24/2021  Delivering provider: Renard Matter  Date of discharge: 08/25/2021  Admitting diagnosis: Normal labor [O80, Z37.9] Intrauterine pregnancy: [redacted]w[redacted]d    Secondary diagnosis:  Principal Problem:   Vaginal delivery Active Problems:   Supervision of other normal pregnancy, antepartum   Rh negative, antepartum   Normal labor  Additional problems: None    Discharge diagnosis: Term Pregnancy Delivered                                              Post partum procedures: None Augmentation: N/A Complications: None  Hospital course: Onset of Labor With Vaginal Delivery      26y.o. yo G2P1001 at 361w6das admitted in Active Labor on 08/23/2021. Patient had an uncomplicated labor course as follows:  Membrane Rupture Time/Date: 1:48 AM ,08/24/2021   Delivery Method:Vaginal, Spontaneous  Episiotomy: None  Lacerations:  2nd degree  Patient had an uncomplicated postpartum course.  She is ambulating, tolerating a regular diet, passing flatus, and urinating well.  Her pain and bleeding are controlled.  She is breastfeeding well.  Patient is discharged home in stable condition on 08/25/21.  Newborn Data: Birth date:08/24/2021  Birth time:3:07 AM  Gender:Female  Living status:Living  Apgars:9 ,9  Weight:3920 g   Magnesium Sulfate received: No BMZ received: No Rhophylac: Given postpartum  MMR: N/A T-DaP: Declined Flu: No Transfusion: No  Physical exam  Vitals:   08/24/21 1452 08/24/21 1845 08/24/21 2137 08/25/21 0537  BP: 116/61 116/77 116/70 120/75  Pulse: 91 97 94 96  Resp: _0 Temp: 98.3 F (36.8 C) 98.5 F (36.9 C) 98.2 F (36.8 C) 98.6 F (37 C)  TempSrc: Oral Oral Oral   SpO2: 100% 97% 99%   Weight:      Height:       General: alert, cooperative, and no distress Lochia: appropriate Uterine Fundus: firm and below  umbilicus  DVT Evaluation: no LE edema or calf tenderness to palpation   Labs: Lab Results  Component Value Date   WBC 15.2 (H) 08/24/2021   HGB 10.1 (L) 08/24/2021   HCT 30.3 (L) 08/24/2021   MCV 87.6 08/24/2021   PLT 208 08/24/2021       No data to display         Edinburgh Score:    08/24/2021    1:04 PM  Edinburgh Postnatal Depression Scale Screening Tool  I have been able to laugh and see the funny side of things. 0  I have looked forward with enjoyment to things. 0  I have blamed myself unnecessarily when things went wrong. 0  I have been anxious or worried for no good reason. 0  I have felt scared or panicky for no good reason. 0  Things have been getting on top of me. 1  I have been so unhappy that I have had difficulty sleeping. 0  I have felt sad or miserable. 0  I have been so unhappy that I have been crying. 0  The thought of harming myself has occurred to me. 0  Edinburgh Postnatal Depression Scale Total 1     After visit meds:  Allergies as of 08/25/2021   No Known Allergies      Medication  List     TAKE these medications    acetaminophen 500 MG tablet Commonly known as: TYLENOL Take 2 tablets (1,000 mg total) by mouth every 8 (eight) hours as needed (pain).   Blood Pressure Kit Devi 1 kit by Does not apply route once a week. Check Blood Pressure regularly and record readings into the Babyscripts App.  Large Cuff.  DX O90.0   ibuprofen 100 MG/5ML suspension Commonly known as: ADVIL Take 30 mLs (600 mg total) by mouth every 6 (six) hours.   prenatal multivitamin Tabs tablet Take 1 tablet by mouth daily after breakfast.         Discharge home in stable condition Infant Feeding: Breast Infant Disposition: home with mother Discharge instruction: per After Visit Summary and Postpartum booklet. Activity: Advance as tolerated. Pelvic rest for 6 weeks.  Diet: routine diet Future Appointments: Future Appointments  Date Time Provider  Department Center  09/29/2021  1:10 PM Forlan, Nicole, NP [facility] None   Follow up Visit: Message sent to [facility] by Dr. Das on 6/14  Please schedule this patient for a In person postpartum visit in 4 weeks with the following provider: Any provider. Additional Postpartum F/U: None   Low risk pregnancy complicated by:  None Delivery mode:  Vaginal, Spontaneous  Anticipated Birth Control: Declined  08/25/2021 Christina M Albert, MD    

## 2021-08-24 NOTE — MAU Note (Signed)
.  Sheryl Lopez is a 26 y.o. at [redacted]w[redacted]d here in MAU reporting ctxs since 2030. Denies LOF or VB. Good FM. No recent sve  Onset of complaint: 2030 Pain score: 6 Vitals:   08/24/21 0002  Pulse: 77  Resp: 16  Temp: 98.5 F (36.9 C)  SpO2: 99%     FHT:130 Lab orders placed from triage: mau labor eval orders

## 2021-08-25 ENCOUNTER — Inpatient Hospital Stay (HOSPITAL_COMMUNITY): Admit: 2021-08-25 | Payer: Self-pay

## 2021-08-25 LAB — RH IG WORKUP (INCLUDES ABO/RH)
Fetal Screen: NEGATIVE
Gestational Age(Wks): 39
Unit division: 0

## 2021-08-25 LAB — GC/CHLAMYDIA PROBE AMP (~~LOC~~) NOT AT ARMC
Chlamydia: NEGATIVE
Comment: NEGATIVE
Comment: NORMAL
Neisseria Gonorrhea: NEGATIVE

## 2021-08-25 LAB — BIRTH TISSUE RECOVERY COLLECTION (PLACENTA DONATION)

## 2021-08-25 MED ORDER — ACETAMINOPHEN 500 MG PO TABS: 1000.0000 mg | ORAL_TABLET | Freq: Three times a day (TID) | ORAL | 0 refills | Status: DC | PRN

## 2021-08-25 MED ORDER — IBUPROFEN 100 MG/5ML PO SUSP: 600.0000 mg | Freq: Four times a day (QID) | ORAL | 1 refills | Status: DC

## 2021-09-01 ENCOUNTER — Telehealth (HOSPITAL_COMMUNITY): Payer: Self-pay | Admitting: *Deleted

## 2021-09-01 NOTE — Telephone Encounter (Signed)
Left phone voicemail message.  Duffy Rhody, RN 09-01-2021 at 1:57pm

## 2021-09-29 ENCOUNTER — Ambulatory Visit: Payer: Medicaid Other | Admitting: Student

## 2023-02-01 ENCOUNTER — Ambulatory Visit: Payer: Medicaid Other | Admitting: *Deleted

## 2023-02-01 VITALS — BP 118/65 | HR 86 | Wt 138.6 lb

## 2023-02-01 DIAGNOSIS — O0930 Supervision of pregnancy with insufficient antenatal care, unspecified trimester: Secondary | ICD-10-CM | POA: Insufficient documentation

## 2023-02-01 DIAGNOSIS — O0932 Supervision of pregnancy with insufficient antenatal care, second trimester: Secondary | ICD-10-CM

## 2023-02-01 DIAGNOSIS — Z1339 Encounter for screening examination for other mental health and behavioral disorders: Secondary | ICD-10-CM

## 2023-02-01 DIAGNOSIS — Z3482 Encounter for supervision of other normal pregnancy, second trimester: Secondary | ICD-10-CM

## 2023-02-01 DIAGNOSIS — Z3480 Encounter for supervision of other normal pregnancy, unspecified trimester: Secondary | ICD-10-CM

## 2023-02-01 DIAGNOSIS — Z3A21 21 weeks gestation of pregnancy: Secondary | ICD-10-CM

## 2023-02-01 NOTE — Progress Notes (Signed)
Late to care. Informal bedside US today shows single fetus. Cardiac activity and fetal movement observed. FHR 140s. Appearance of fetus and informal FL and HC consistent with LMP provided. Formal measurements deferred per MFM request.

## 2023-02-01 NOTE — Progress Notes (Signed)
New OB Intake  I connected with Sheryl Lopez  on 02/01/23 at  3:10 PM EST by In Person Visit and verified that I am speaking with the correct person using two identifiers. Nurse is located at [facility] and pt is located at [place].  I discussed the limitations, risks, security and privacy concerns of performing an evaluation and management service by telephone and the availability of in person appointments. I also discussed with the patient that there may be a patient responsible charge related to this service. The patient expressed understanding and agreed to proceed.  I explained I am completing New OB Intake today. We discussed EDD of Not found.. Pt is [id number]. I reviewed her allergies, medications and Medical/Surgical/OB history.    Patient Active Problem List   Diagnosis Date Noted   Normal labor 08/24/2021   Vaginal delivery 08/24/2021   Rh negative, antepartum 06/01/2021   Supervision of other normal pregnancy, antepartum 05/18/2021   History of gestational diabetes 05/18/2021    Concerns addressed today  Delivery Plans Plans to deliver at [facility]. Discussed the nature of our practice with multiple providers including residents and students. Due to the size of the practice, the delivering provider may not be the same as those providing prenatal care.   Patient is not interested in water birth. Offered upcoming OB visit with CNM to discuss further.  MyChart/Babyscripts MyChart access verified. I explained pt will have some visits in office and some virtually. Babyscripts instructions given and order placed. Patient verifies receipt of registration text/e-mail. Account successfully created and app downloaded.  Blood Pressure Cuff/Weight Scale Blood pressure cuff ordered for patient to pick-up from [employer]. Explained after first prenatal appt pt will check weekly and document in Babyscripts. Patient does not have weight scale; patient may purchase if they desire to track  weight weekly in Babyscripts.  Anatomy US Explained first scheduled [place] will be around 19 weeks. Anatomy US scheduled for TBD at TBD.  Interested in [place]? If yes, send referral and doula dot phrase.   Is patient a candidate for Babyscripts Optimization? Yes  First visit review I reviewed new OB appt with patient. Explained pt will be seen by Raelyn Mora, CNM at first visit. Discussed Avelina Laine genetic screening with patient. Declined Panorama and Horizon.. Routine prenatal labs  deferred to New OB appt per pt request.    Last Pap No results found for: "DIAGPAP"  Harrel Lemon, RN 02/01/2023  3:09 PM

## 2023-02-01 NOTE — Patient Instructions (Signed)

## 2023-02-14 ENCOUNTER — Encounter: Payer: Self-pay | Admitting: Obstetrics and Gynecology

## 2023-02-14 ENCOUNTER — Other Ambulatory Visit (HOSPITAL_COMMUNITY)
Admission: RE | Admit: 2023-02-14 | Discharge: 2023-02-14 | Disposition: A | Discharge status: Home / Self Care | Payer: Medicaid Other | Source: Ambulatory Visit | Attending: Obstetrics and Gynecology | Admitting: Obstetrics and Gynecology

## 2023-02-14 ENCOUNTER — Ambulatory Visit (INDEPENDENT_AMBULATORY_CARE_PROVIDER_SITE_OTHER): Payer: Medicaid Other | Admitting: Obstetrics and Gynecology

## 2023-02-14 ENCOUNTER — Other Ambulatory Visit: Payer: Medicaid Other

## 2023-02-14 VITALS — BP 117/65 | HR 91 | Wt 142.0 lb

## 2023-02-14 DIAGNOSIS — Z3480 Encounter for supervision of other normal pregnancy, unspecified trimester: Secondary | ICD-10-CM | POA: Insufficient documentation

## 2023-02-14 DIAGNOSIS — Z3A23 23 weeks gestation of pregnancy: Secondary | ICD-10-CM

## 2023-02-14 DIAGNOSIS — O0932 Supervision of pregnancy with insufficient antenatal care, second trimester: Secondary | ICD-10-CM | POA: Diagnosis not present

## 2023-02-14 DIAGNOSIS — Z3482 Encounter for supervision of other normal pregnancy, second trimester: Secondary | ICD-10-CM

## 2023-02-14 NOTE — Progress Notes (Signed)
INITIAL OBSTETRICAL VISIT Patient name: Sheryl Lopez MRN [id number]  Date of birth: Aug 03, 1995 Chief Complaint:   Initial Prenatal Visit  History of Present Illness:   Sheryl Lopez is a 27 y.o. G70P2002 Caucasian female at [redacted]w[redacted]d by LMP with an Estimated Date of Delivery: 06/12/23 being seen today for her initial obstetrical visit.  Her obstetrical history is significant for  Rh Neg, h/o A1GDM with 1st pregnancy, SVD x 2 (2021 & 2023), and late [facility] with current pregnancy . This is a planned pregnancy. She and the father of the baby (FOB) her spouse live together. She has a support system that consists of her spouse/her family/friends. Today she reports no complaints.   Patient's last menstrual period was 09/05/2022. Last pap 2021. Results were: normal Review of Systems:   Pertinent items are noted in HPI Denies cramping/contractions, leakage of fluid, vaginal bleeding, abnormal vaginal discharge w/ itching/odor/irritation, headaches, visual changes, shortness of breath, chest pain, abdominal pain, severe nausea/vomiting, or problems with urination or bowel movements unless otherwise stated above.  Pertinent History Reviewed:  Reviewed past medical,surgical, social, obstetrical and family history.  Reviewed problem list, medications and allergies. OB History  Gravida Para Term Preterm AB Living  3 2 2    0 2  SAB IAB Ectopic Multiple Live Births  0 0 0 0 2    # Outcome Date GA Lbr Len/2nd Weight Sex Type Anes PTL Lv  3 Current           2 Term 08/24/21 102w6d 06:31 / 00:06 8 lb 10.3 oz (3.92 kg) M Vag-Spont Local  LIV  1 Term 08/05/19 [redacted]w[redacted]d  8 lb (3.629 kg) F Vag-Spont  N LIV   Physical Assessment:   Vitals:   02/14/23 1003  BP: 117/65  Pulse: 91  Weight: 142 lb (64.4 kg)  Body mass index is 23.63 kg/m.       Physical Examination:  General appearance - well appearing, and in no distress  Mental status - alert, oriented to person, place, and time  Psych:  She has a normal mood and  affect  Skin - warm and dry, normal color, no suspicious lesions noted  Chest - effort normal, all lung fields clear to auscultation bilaterally  Heart - normal rate and regular rhythm  Abdomen - soft, nontender  Extremities:  No swelling or varicosities noted  Pelvic - VULVA: normal appearing vulva with no masses, tenderness or lesions  VAGINA: normal appearing vagina with normal color and discharge, no lesions.   CERVIX: normal appearing cervix without discharge or lesions, no CMT  Thin prep pap is not done (patient declines to have it done today)   FHTs by doppler: 161 bpm  Assessment & Plan:  1) Low-Risk Pregnancy G3P2002 at [redacted]w[redacted]d with an Estimated Date of Delivery: 06/12/23   2) Initial OB visit - Welcomed to practice and introduced self to patient in addition to discussing other advanced practice providers that she may be seeing at this practice - Congratulated patient - Anticipatory guidance on upcoming appointments - Educated on COVID19 and pregnancy and the integration of virtual appointments  - Educated on babyscripts app- patient reports she has not received email, encouraged to look in spam folder and to call office if she still has not received email - patient verbalizes understanding    3) Supervision of other normal pregnancy, antepartum - Glucose Tolerance, 2 Hours w/1 Hour>> NO need to rpt at 28 wks - CBC/D/Plt+RPR+Rh+ABO+RubIgG... - Culture, OB Urine - Cervicovaginal ancillary only(  [place]) - Anatomy U/S scheduled for 03/16/2023 - Patient would like to defer pap until PP visit  4) Encounter for supervision of other normal pregnancy in second trimester - HORIZON Basic Panel - PANORAMA PRENATAL TEST    Meds: No orders of the defined types were placed in this encounter.   Initial labs obtained Continue prenatal vitamins Reviewed n/v relief measures and warning s/s to report Reviewed recommended weight gain based on pre-gravid BMI Encouraged well-balanced  diet Genetic Screening discussed: ordered Cystic fibrosis, SMA, Fragile X screening discussed ordered The nature of [place] [facility] with multiple MDs and other Advanced Practice Providers was explained to patient; also emphasized that residents, students are part of our team.  Discussed optimized OB schedule and video visits. Advised can have an in-office visit whenever she feels she needs to be seen.  Does have BP cuff. Check BP weekly, report BP >140/90. Advised to call during normal business hours and there is an after-hours nurse line available.   Indications for ASA therapy (per uptodate) One of the following: Previous pregnancy with preeclampsia, especially early onset and with an adverse outcome? no Multifetal gestation? no Chronic hypertension? no Type 1 or 2 diabetes mellitus? no Chronic kidney disease? no Autoimmune disease (antiphospholipid syndrome, systemic lupus erythematosus)? no   Two or more of the following: Nulliparity? no Obesity (body mass index >30 kg/m2)? no Family history of preeclampsia in mother or sister? no Age >=35 years? no Sociodemographic characteristics (African American race, low socioeconomic level)? no Personal risk factors (eg, previous pregnancy with low birth weight or small for gestational age infant, previous adverse pregnancy outcome [eg, stillbirth], interval >10 years between pregnancies)? no   Indications for early GDM screening  First-degree relative with diabetes? no BMI >30kg/m2? no Age > 35 years? no Previous birth of an infant weighing >=4000 g? no Gestational diabetes mellitus in a previous pregnancy? yes Glycated hemoglobin >=5.7 percent (39 mmol/mol), impaired glucose tolerance, or impaired fasting glucose on previous testing? no High-risk race/ethnicity (eg, African [address], Latino, Native [address], [place] American, [place] Islander)? no Previous stillbirth of unknown cause? no Maternal birthweight  > 9 lbs? no History of cardiovascular disease? no Hypertension or on therapy for hypertension? no High-density lipoprotein cholesterol level <35 mg/dL (4.09 mmol/L) and/or a triglyceride level >250 mg/dL (8.11 mmol/L)? no Polycystic ovarian syndrome (PCOS)? no Physical inactivity? no Other clinical condition associated with insulin resistance (eg, severe obesity, acanthosis nigricans)? no Current use of glucocorticoids? no  Follow-up: Return in about 4 weeks (around 03/14/2023) for Return OB visit.   Orders Placed This Encounter  Procedures   Culture, OB Urine   Glucose Tolerance, 2 Hours w/1 Hour   HORIZON Basic Panel   CBC/D/Plt+RPR+Rh+ABO+RubIgG...   PANORAMA PRENATAL TEST    Raelyn Mora MSN, CNM 02/14/2023 1:19 PM

## 2023-02-14 NOTE — Progress Notes (Signed)
Pt presents for NOB visit. Pt reports last PAP 2021, no Hx of abnormal PAP. Wants to defer PAP PP

## 2023-02-15 LAB — CBC/D/PLT+RPR+RH+ABO+RUBIGG...
Antibody Screen: NEGATIVE
Basophils Absolute: 0 10*3/uL (ref 0.0–0.2)
Basos: 1 %
EOS (ABSOLUTE): 0.1 10*3/uL (ref 0.0–0.4)
Eos: 1 %
HCV Ab: NONREACTIVE
HIV Screen 4th Generation wRfx: NONREACTIVE
Hematocrit: 32.4 % — ABNORMAL LOW (ref 34.0–46.6)
Hemoglobin: 10.8 g/dL — ABNORMAL LOW (ref 11.1–15.9)
Hepatitis B Surface Ag: NEGATIVE
Immature Grans (Abs): 0 10*3/uL (ref 0.0–0.1)
Immature Granulocytes: 0 %
Lymphocytes Absolute: 1.3 10*3/uL (ref 0.7–3.1)
Lymphs: 21 %
MCH: 31.8 pg (ref 26.6–33.0)
MCHC: 33.3 g/dL (ref 31.5–35.7)
MCV: 95 fL (ref 79–97)
Monocytes Absolute: 0.3 10*3/uL (ref 0.1–0.9)
Monocytes: 5 %
Neutrophils Absolute: 4.6 10*3/uL (ref 1.4–7.0)
Neutrophils: 72 %
Platelets: 222 10*3/uL (ref 150–450)
RBC: 3.4 x10E6/uL — ABNORMAL LOW (ref 3.77–5.28)
RDW: 11.9 % (ref 11.7–15.4)
RPR Ser Ql: NONREACTIVE
Rh Factor: NEGATIVE
Rubella Antibodies, IGG: 3.89 {index} (ref >0.99)
WBC: 6.3 10*3/uL (ref 3.4–10.8)

## 2023-02-15 LAB — HCV INTERPRETATION

## 2023-02-15 LAB — GLUCOSE TOLERANCE, 2 HOURS W/ 1HR
Glucose, 1 hour: 94 mg/dL (ref 70–179)
Glucose, 2 hour: 87 mg/dL (ref 70–152)
Glucose, Fasting: 59 mg/dL — ABNORMAL LOW (ref 70–91)

## 2023-02-16 LAB — CERVICOVAGINAL ANCILLARY ONLY
Chlamydia: NEGATIVE
Comment: NEGATIVE
Comment: NEGATIVE
Comment: NORMAL
Neisseria Gonorrhea: NEGATIVE
Trichomonas: NEGATIVE

## 2023-02-16 LAB — CULTURE, OB URINE

## 2023-02-16 LAB — URINE CULTURE, OB REFLEX

## 2023-02-21 ENCOUNTER — Encounter: Payer: Self-pay | Admitting: Obstetrics and Gynecology

## 2023-02-21 LAB — PANORAMA PRENATAL TEST FULL PANEL:PANORAMA TEST PLUS 5 ADDITIONAL MICRODELETIONS: FETAL FRACTION: 21.1

## 2023-02-21 LAB — HORIZON CUSTOM: REPORT SUMMARY: NEGATIVE

## 2023-03-14 NOTE — L&D Delivery Note (Signed)
 OB/GYN Faculty Practice Delivery Note  Sheryl Lopez is a 28 y.o. G3 now P3003 s/p SVD at [redacted]w[redacted]d. She was admitted for labor.   ROM: 0h 59m with meconium-stained fluid GBS Status: unknown, patient declines swab   Maximum Maternal Temperature: 97.7F  Labor Progress: Initial SVE 7/90/-1 SROM with meconium at 02:18 Patient then progressed to complete  Delivery Date/Time: 06/14/2023 at 02:30 Delivery: Called to room and patient was complete and pushing. Head delivered LOA. No nuchal cord present. Shoulder and body delivered in usual fashion. Infant with spontaneous cry, placed on mother's abdomen, dried and stimulated. Cord clamped x2 after 1-minute delay, and cut by FOB. Cord blood drawn. Placenta delivered spontaneously with gentle cord traction. Fundus firm with massage and Pitocin. Labia, perineum, vagina, and cervix inspected inspected. First degree perineal laceration was hemostatic following repair.  Baby Weight: pending  Placenta: Sent to L&D Complications: None Lacerations: 1st degree perineal EBL: Analgesia: None   Infant:  APGAR (1 MIN): 9  APGAR (5 MINS): 9   Sheryl Lopez is a 27y G3 now P3003 s/p SVD at [redacted]w[redacted]d after being admitted for labor.  --PPD0: Routine PP care. EBL , Hgb 10.1 on admit. --Rh negative: PP rhogam studies --Limited PNC  --Rh neg/GBS unk/girl/breast/?  Dispo: Admit to MB for routine PP care. Anticipate discharge on PPD1 or 2.     Thereasa Solo, MD [facility] 06/14/2023, 3:10 AM

## 2023-03-16 ENCOUNTER — Other Ambulatory Visit: Payer: Self-pay | Admitting: Obstetrics & Gynecology

## 2023-03-16 ENCOUNTER — Ambulatory Visit: Payer: Medicaid Other | Attending: Obstetrics & Gynecology

## 2023-03-16 ENCOUNTER — Ambulatory Visit: Payer: Medicaid Other | Admitting: *Deleted

## 2023-03-16 VITALS — BP 121/71 | HR 94

## 2023-03-16 DIAGNOSIS — O09292 Supervision of pregnancy with other poor reproductive or obstetric history, second trimester: Secondary | ICD-10-CM | POA: Insufficient documentation

## 2023-03-16 DIAGNOSIS — Z3480 Encounter for supervision of other normal pregnancy, unspecified trimester: Secondary | ICD-10-CM | POA: Diagnosis present

## 2023-03-16 DIAGNOSIS — Z8632 Personal history of gestational diabetes: Secondary | ICD-10-CM | POA: Insufficient documentation

## 2023-03-16 DIAGNOSIS — Z3A27 27 weeks gestation of pregnancy: Secondary | ICD-10-CM | POA: Diagnosis not present

## 2023-03-16 DIAGNOSIS — O09299 Supervision of pregnancy with other poor reproductive or obstetric history, unspecified trimester: Secondary | ICD-10-CM

## 2023-03-16 DIAGNOSIS — Z363 Encounter for antenatal screening for malformations: Secondary | ICD-10-CM | POA: Insufficient documentation

## 2023-03-16 DIAGNOSIS — O321XX0 Maternal care for breech presentation, not applicable or unspecified: Secondary | ICD-10-CM | POA: Diagnosis not present

## 2023-03-16 DIAGNOSIS — O0932 Supervision of pregnancy with insufficient antenatal care, second trimester: Secondary | ICD-10-CM

## 2023-03-16 DIAGNOSIS — O24112 Pre-existing diabetes mellitus, type 2, in pregnancy, second trimester: Secondary | ICD-10-CM | POA: Insufficient documentation

## 2023-03-19 ENCOUNTER — Encounter: Payer: Medicaid Other | Admitting: Advanced Practice Midwife

## 2023-05-17 ENCOUNTER — Encounter: Admitting: Nurse Practitioner

## 2023-06-13 ENCOUNTER — Inpatient Hospital Stay (HOSPITAL_COMMUNITY)
Admission: AD | Admit: 2023-06-13 | Discharge: 2023-06-15 | DRG: 807 | Disposition: A | Discharge status: Home / Self Care | Attending: Obstetrics & Gynecology | Admitting: Obstetrics & Gynecology

## 2023-06-13 DIAGNOSIS — Z8632 Personal history of gestational diabetes: Secondary | ICD-10-CM

## 2023-06-13 DIAGNOSIS — O09299 Supervision of pregnancy with other poor reproductive or obstetric history, unspecified trimester: Secondary | ICD-10-CM

## 2023-06-13 DIAGNOSIS — O0930 Supervision of pregnancy with insufficient antenatal care, unspecified trimester: Secondary | ICD-10-CM

## 2023-06-13 DIAGNOSIS — Z3480 Encounter for supervision of other normal pregnancy, unspecified trimester: Principal | ICD-10-CM

## 2023-06-13 DIAGNOSIS — Z6791 Unspecified blood type, Rh negative: Secondary | ICD-10-CM

## 2023-06-13 DIAGNOSIS — Z833 Family history of diabetes mellitus: Secondary | ICD-10-CM

## 2023-06-13 DIAGNOSIS — O26893 Other specified pregnancy related conditions, third trimester: Principal | ICD-10-CM | POA: Diagnosis present

## 2023-06-13 DIAGNOSIS — Z3A40 40 weeks gestation of pregnancy: Secondary | ICD-10-CM

## 2023-06-13 NOTE — MAU Note (Signed)
 Sheryl Lopez is a 28 y.o. at [redacted]w[redacted]d here in MAU reporting: ctx since 2130. Pt states they are every 3-5 minutes. Pt denies LOF or VB. +FM   Pt has missed prenatal appointments. Pt states the last time she went was between 25-30 weeks she thinks.    Onset of complaint: 2130 Pain score: 8/10  Vitals:   06/13/23 2353  BP: 118/63  Pulse: 90  Resp: 16  Temp: 97.6 F (36.4 C)  SpO2: 100%     FHT:152 Lab orders placed from triage:  mau labor

## 2023-06-14 ENCOUNTER — Encounter (HOSPITAL_COMMUNITY): Payer: Self-pay | Admitting: Obstetrics & Gynecology

## 2023-06-14 ENCOUNTER — Other Ambulatory Visit: Payer: Self-pay

## 2023-06-14 DIAGNOSIS — O0933 Supervision of pregnancy with insufficient antenatal care, third trimester: Secondary | ICD-10-CM | POA: Diagnosis not present

## 2023-06-14 DIAGNOSIS — Z3A40 40 weeks gestation of pregnancy: Secondary | ICD-10-CM | POA: Diagnosis not present

## 2023-06-14 DIAGNOSIS — O480 Post-term pregnancy: Secondary | ICD-10-CM | POA: Diagnosis not present

## 2023-06-14 DIAGNOSIS — Z833 Family history of diabetes mellitus: Secondary | ICD-10-CM | POA: Diagnosis not present

## 2023-06-14 DIAGNOSIS — Z6791 Unspecified blood type, Rh negative: Secondary | ICD-10-CM | POA: Diagnosis not present

## 2023-06-14 DIAGNOSIS — Z8632 Personal history of gestational diabetes: Secondary | ICD-10-CM | POA: Diagnosis not present

## 2023-06-14 DIAGNOSIS — O26893 Other specified pregnancy related conditions, third trimester: Secondary | ICD-10-CM | POA: Diagnosis present

## 2023-06-14 LAB — CBC
HCT: 27.9 % — ABNORMAL LOW (ref 36.0–46.0)
HCT: 31.3 % — ABNORMAL LOW (ref 36.0–46.0)
Hemoglobin: 10.1 g/dL — ABNORMAL LOW (ref 12.0–15.0)
Hemoglobin: 9.2 g/dL — ABNORMAL LOW (ref 12.0–15.0)
MCH: 27.3 pg (ref 26.0–34.0)
MCH: 27.9 pg (ref 26.0–34.0)
MCHC: 32.3 g/dL (ref 30.0–36.0)
MCHC: 33 g/dL (ref 30.0–36.0)
MCV: 84.5 fL (ref 80.0–100.0)
MCV: 84.6 fL (ref 80.0–100.0)
Platelets: 208 10*3/uL (ref 150–400)
Platelets: 213 10*3/uL (ref 150–400)
RBC: 3.3 MIL/uL — ABNORMAL LOW (ref 3.87–5.11)
RBC: 3.7 MIL/uL — ABNORMAL LOW (ref 3.87–5.11)
RDW: 14.3 % (ref 11.5–15.5)
RDW: 14.4 % (ref 11.5–15.5)
WBC: 10.7 10*3/uL — ABNORMAL HIGH (ref 4.0–10.5)
WBC: 14.1 10*3/uL — ABNORMAL HIGH (ref 4.0–10.5)
nRBC: 0 % (ref 0.0–0.2)
nRBC: 0 % (ref 0.0–0.2)

## 2023-06-14 LAB — TYPE AND SCREEN
ABO/RH(D): O NEG
Antibody Screen: NEGATIVE

## 2023-06-14 LAB — RPR: RPR Ser Ql: NONREACTIVE

## 2023-06-14 MED ORDER — SODIUM CHLORIDE 0.9% FLUSH
3.0000 mL | Freq: Two times a day (BID) | INTRAVENOUS | Status: DC
  Administered 2023-06-14: 3 mL via INTRAVENOUS

## 2023-06-14 MED ORDER — PENICILLIN G POTASSIUM 5000000 UNITS IJ SOLR: 5.0000 10*6.[IU] | Freq: Once | INTRAMUSCULAR | Status: DC

## 2023-06-14 MED ORDER — SENNOSIDES-DOCUSATE SODIUM 8.6-50 MG PO TABS
2.0000 | ORAL_TABLET | ORAL | Status: DC
  Administered 2023-06-14 – 2023-06-15 (×2): 2 via ORAL
  Filled 2023-06-14 (×2): qty 2

## 2023-06-14 MED ORDER — LACTATED RINGERS IV SOLN: INTRAVENOUS | Status: DC

## 2023-06-14 MED ORDER — FENTANYL CITRATE (PF) 100 MCG/2ML IJ SOLN
50.0000 ug | INTRAMUSCULAR | Status: DC | PRN
Start: 2023-06-14 — End: 2023-06-14

## 2023-06-14 MED ORDER — TRANEXAMIC ACID-NACL 1000-0.7 MG/100ML-% IV SOLN
1000.0000 mg | Freq: Once | INTRAVENOUS | Status: AC
  Administered 2023-06-14: 1000 mg via INTRAVENOUS
  Filled 2023-06-14: qty 100

## 2023-06-14 MED ORDER — OXYTOCIN-SODIUM CHLORIDE 30-0.9 UT/500ML-% IV SOLN
2.5000 [IU]/h | INTRAVENOUS | Status: DC
  Filled 2023-06-14: qty 500

## 2023-06-14 MED ORDER — BENZOCAINE-MENTHOL 20-0.5 % EX AERO: 1.0000 | INHALATION_SPRAY | CUTANEOUS | Status: DC | PRN

## 2023-06-14 MED ORDER — DIBUCAINE (PERIANAL) 1 % EX OINT: 1.0000 | TOPICAL_OINTMENT | CUTANEOUS | Status: DC | PRN

## 2023-06-14 MED ORDER — SODIUM CHLORIDE 0.9% FLUSH: 3.0000 mL | INTRAVENOUS | Status: DC | PRN

## 2023-06-14 MED ORDER — RHO D IMMUNE GLOBULIN 1500 UNIT/2ML IJ SOSY
300.0000 ug | PREFILLED_SYRINGE | Freq: Once | INTRAMUSCULAR | Status: AC
  Administered 2023-06-14: 300 ug via INTRAVENOUS
  Filled 2023-06-14: qty 2

## 2023-06-14 MED ORDER — SOD CITRATE-CITRIC ACID 500-334 MG/5ML PO SOLN: 30.0000 mL | ORAL | Status: DC | PRN

## 2023-06-14 MED ORDER — OXYCODONE-ACETAMINOPHEN 5-325 MG PO TABS: 2.0000 | ORAL_TABLET | ORAL | Status: DC | PRN

## 2023-06-14 MED ORDER — MEASLES, MUMPS & RUBELLA VAC IJ SOLR: 0.5000 mL | Freq: Once | INTRAMUSCULAR | Status: DC

## 2023-06-14 MED ORDER — ACETAMINOPHEN 325 MG PO TABS: 650.0000 mg | ORAL_TABLET | ORAL | Status: DC | PRN

## 2023-06-14 MED ORDER — PRENATAL MULTIVITAMIN CH
1.0000 | ORAL_TABLET | Freq: Every day | ORAL | Status: DC
  Administered 2023-06-14: 1 via ORAL
  Filled 2023-06-14 (×2): qty 1

## 2023-06-14 MED ORDER — TERBUTALINE SULFATE 1 MG/ML IJ SOLN: 0.2500 mg | Freq: Once | INTRAMUSCULAR | Status: DC | PRN

## 2023-06-14 MED ORDER — MISOPROSTOL 200 MCG PO TABS
ORAL_TABLET | ORAL | Status: AC
  Administered 2023-06-14: 400 ug via ORAL
  Filled 2023-06-14: qty 2

## 2023-06-14 MED ORDER — OXYTOCIN BOLUS FROM INFUSION
333.0000 mL | Freq: Once | INTRAVENOUS | Status: AC
  Administered 2023-06-14: 333 mL via INTRAVENOUS

## 2023-06-14 MED ORDER — ONDANSETRON HCL 4 MG PO TABS: 4.0000 mg | ORAL_TABLET | ORAL | Status: DC | PRN

## 2023-06-14 MED ORDER — COCONUT OIL OIL: 1.0000 | TOPICAL_OIL | Status: DC | PRN

## 2023-06-14 MED ORDER — RHO D IMMUNE GLOBULIN 1500 UNIT/2ML IJ SOSY
300.0000 ug | PREFILLED_SYRINGE | Freq: Once | INTRAMUSCULAR | Status: DC
  Filled 2023-06-14: qty 2

## 2023-06-14 MED ORDER — ZOLPIDEM TARTRATE 5 MG PO TABS: 5.0000 mg | ORAL_TABLET | Freq: Every evening | ORAL | Status: DC | PRN

## 2023-06-14 MED ORDER — DIPHENHYDRAMINE HCL 50 MG/ML IJ SOLN: 12.5000 mg | INTRAMUSCULAR | Status: DC | PRN

## 2023-06-14 MED ORDER — WITCH HAZEL-GLYCERIN EX PADS: 1.0000 | MEDICATED_PAD | CUTANEOUS | Status: DC | PRN

## 2023-06-14 MED ORDER — EPHEDRINE 5 MG/ML INJ: 10.0000 mg | INTRAVENOUS | Status: DC | PRN

## 2023-06-14 MED ORDER — IBUPROFEN 600 MG PO TABS
600.0000 mg | ORAL_TABLET | Freq: Four times a day (QID) | ORAL | Status: DC
  Administered 2023-06-14 – 2023-06-15 (×5): 600 mg via ORAL
  Filled 2023-06-14 (×6): qty 1

## 2023-06-14 MED ORDER — FENTANYL-BUPIVACAINE-NACL 0.5-0.125-0.9 MG/250ML-% EP SOLN: 12.0000 mL/h | EPIDURAL | Status: DC | PRN

## 2023-06-14 MED ORDER — PHENYLEPHRINE 80 MCG/ML (10ML) SYRINGE FOR IV PUSH (FOR BLOOD PRESSURE SUPPORT): 80.0000 ug | PREFILLED_SYRINGE | INTRAVENOUS | Status: DC | PRN

## 2023-06-14 MED ORDER — PENICILLIN G POT IN DEXTROSE 60000 UNIT/ML IV SOLN: 3.0000 10*6.[IU] | INTRAVENOUS | Status: DC

## 2023-06-14 MED ORDER — ONDANSETRON HCL 4 MG/2ML IJ SOLN: 4.0000 mg | INTRAMUSCULAR | Status: DC | PRN

## 2023-06-14 MED ORDER — LACTATED RINGERS IV SOLN: 500.0000 mL | INTRAVENOUS | Status: DC | PRN

## 2023-06-14 MED ORDER — LACTATED RINGERS IV SOLN: 500.0000 mL | Freq: Once | INTRAVENOUS | Status: DC

## 2023-06-14 MED ORDER — DIPHENHYDRAMINE HCL 25 MG PO CAPS: 25.0000 mg | ORAL_CAPSULE | Freq: Four times a day (QID) | ORAL | Status: DC | PRN

## 2023-06-14 MED ORDER — SODIUM CHLORIDE 0.9 % IV SOLN: 250.0000 mL | INTRAVENOUS | Status: DC | PRN

## 2023-06-14 MED ORDER — OXYCODONE-ACETAMINOPHEN 5-325 MG PO TABS: 1.0000 | ORAL_TABLET | ORAL | Status: DC | PRN

## 2023-06-14 MED ORDER — MISOPROSTOL 200 MCG PO TABS: 400.0000 ug | ORAL_TABLET | Freq: Once | ORAL | Status: AC

## 2023-06-14 MED ORDER — SIMETHICONE 80 MG PO CHEW: 80.0000 mg | CHEWABLE_TABLET | ORAL | Status: DC | PRN

## 2023-06-14 MED ORDER — OXYTOCIN-SODIUM CHLORIDE 30-0.9 UT/500ML-% IV SOLN: 1.0000 m[IU]/min | INTRAVENOUS | Status: DC

## 2023-06-14 MED ORDER — LIDOCAINE HCL (PF) 1 % IJ SOLN
30.0000 mL | INTRAMUSCULAR | Status: AC | PRN
  Administered 2023-06-14: 30 mL via SUBCUTANEOUS
  Filled 2023-06-14: qty 30

## 2023-06-14 MED ORDER — ONDANSETRON HCL 4 MG/2ML IJ SOLN: 4.0000 mg | Freq: Four times a day (QID) | INTRAMUSCULAR | Status: DC | PRN

## 2023-06-14 MED ORDER — TETANUS-DIPHTH-ACELL PERTUSSIS 5-2.5-18.5 LF-MCG/0.5 IM SUSY: 0.5000 mL | PREFILLED_SYRINGE | Freq: Once | INTRAMUSCULAR | Status: DC

## 2023-06-14 NOTE — Lactation Note (Signed)
 This note was copied from a baby's chart. Lactation Consultation Note  Patient Name: Girl Mashal Slavick [id number] Date: 06/14/2023 Age:29 hours Reason for consult: Initial assessment;Term Experienced BF mom stated this baby has been BF great w/no issues. Mom didn't have any challenges BF her 2 older children either. Mom doesn't have any questions or concerns at this time. Feels like things are going well. Newborn feeding habits, STS, I&O reviewed. Mom encouraged to feed baby 8-12 times/24 hours and with feeding cues.  Encouraged mom if she needs assistance or has questions to call other Lactation wouldn't bother her. Mom thanked LC.  Maternal Data Does the patient have breastfeeding experience prior to this delivery?: Yes How long did the patient breastfeed?: 1st. child 12 yrs old for 54 months, 2nd child almost 2 yrs old for 11 months  Feeding    LATCH Score Latch: Grasps breast easily, tongue down, lips flanged, rhythmical sucking.  Audible Swallowing: Spontaneous and intermittent  Type of Nipple: Everted at rest and after stimulation  Comfort (Breast/Nipple): Soft / non-tender  Hold (Positioning): No assistance needed to correctly position infant at breast.  LATCH Score: 10   Lactation Tools Discussed/Used    Interventions Interventions: Breast feeding basics reviewed;LC Services brochure  Discharge    Consult Status Consult Status: Complete    Jalyssa Fleisher G 06/14/2023, 6:27 AM

## 2023-06-14 NOTE — Discharge Summary (Signed)
 Postpartum Discharge Summary  Date of Service updated 06/15/2023     Patient Name: Sheryl Lopez DOB: 26-Sep-1995 MRN: [id number]  Date of admission: 06/13/2023 Delivery date:06/14/2023 Delivering provider: Hermann Look D Date of discharge: 06/15/2023  Admitting diagnosis: Normal labor [O80, Z37.9] Intrauterine pregnancy: [redacted]w[redacted]d     Secondary diagnosis:  Active Problems:   History of gestational diabetes in prior pregnancy, currently pregnant   Late prenatal care affecting pregnancy   Normal labor  Additional problems: none    Discharge diagnosis: Term Pregnancy Delivered                                              Post partum procedures:rhogam  Augmentation: N/A Complications: None  Hospital course: Onset of Labor With Vaginal Delivery      28 y.o. yo G3P3003 at [redacted]w[redacted]d was admitted in Active Labor on 06/13/2023. Labor course was uncomplicated other than GBS declined.   Membrane Rupture Time/Date: 2:18 AM,06/14/2023  Delivery Method:Vaginal, Spontaneous Operative Delivery:N/A Episiotomy: None Lacerations:  1st degree Patient had a postpartum course complicated by none.  She is ambulating, tolerating a regular diet, passing flatus, and urinating well. Patient is discharged home in stable condition on 06/15/23.  Newborn Data: Birth date:06/14/2023 Birth time:2:30 AM Gender:Female Living status:Living Apgars:9 ,9  Weight:3760 g (8lb 4.6oz)  Magnesium Sulfate received: No BMZ received: No Rhophylac:Yes  MMR:N/A T-DaP: didn't get prenatally Flu: No RSV Vaccine received: No Transfusion:No  Immunizations received: There is no immunization history for the selected administration types on file for this patient.  Physical exam  Vitals:   06/14/23 1457 06/14/23 1832 06/14/23 2108 06/15/23 0500  BP: 118/60 115/74 118/74 116/72  Pulse: (!) 108 (!) 115 (!) 104 84  Resp:  17 17 16   Temp: 98.1 F (36.7 C) 98.5 F (36.9 C)    TempSrc: Oral Oral    SpO2: 98% 98% 98% 100%   Weight:      Height:       General: alert, cooperative, and no distress Lochia: appropriate Uterine Fundus: firm Incision: Healing well with no significant drainage, N/A DVT Evaluation: No evidence of DVT seen on physical exam. Labs: Lab Results  Component Value Date   WBC 14.1 (H) 06/14/2023   HGB 9.2 (L) 06/14/2023   HCT 27.9 (L) 06/14/2023   MCV 84.5 06/14/2023   PLT 208 06/14/2023       No data to display         Edinburgh Score:    06/14/2023    6:32 PM  Edinburgh Postnatal Depression Scale Screening Tool  I have been able to laugh and see the funny side of things. 0  I have looked forward with enjoyment to things. 0  I have blamed myself unnecessarily when things went wrong. 0  I have been anxious or worried for no good reason. 0  I have felt scared or panicky for no good reason. 0  Things have been getting on top of me. 0  I have been so unhappy that I have had difficulty sleeping. 0  I have felt sad or miserable. 0  I have been so unhappy that I have been crying. 0  The thought of harming myself has occurred to me. 0  Edinburgh Postnatal Depression Scale Total 0   Edinburgh Postnatal Depression Scale Total: 0   After visit meds:  Allergies as  of 06/15/2023   No Known Allergies      Medication List     TAKE these medications    acetaminophen 325 MG tablet Commonly known as: Tylenol Take 2 tablets (650 mg total) by mouth every 4 (four) hours as needed (for pain scale < 4).   ferrous sulfate 325 (65 FE) MG tablet Take 1 tablet (325 mg total) by mouth every other day.   ibuprofen 600 MG tablet Commonly known as: ADVIL Take 1 tablet (600 mg total) by mouth every 6 (six) hours.   prenatal multivitamin Tabs tablet Take 1 tablet by mouth daily after breakfast.         Discharge home in stable condition Infant Feeding: Breast Infant Disposition:home with mother Discharge instruction: per After Visit Summary and Postpartum booklet. Activity:  Advance as tolerated. Pelvic rest for 6 weeks.  Diet: routine diet Future Appointments: Future Appointments  Date Time Provider Department Center  07/12/2023  8:55 AM Abigail Abler, MD [facility] None   Follow up Visit: Message to [facility] 4/3  Please schedule this patient for a In person postpartum visit in 4 weeks with the following provider: Any provider. Additional Postpartum F/U: n/a   Low risk pregnancy complicated by:  n/a Delivery mode:  Vaginal, Spontaneous Anticipated Birth Control:  Unsure   06/15/2023 Liliana Regulus, MD

## 2023-06-14 NOTE — H&P (Signed)
 OBSTETRIC ADMISSION HISTORY AND PHYSICAL  Sheryl Lopez is a 28 y.o. female G3P2002 with IUP at [redacted]w[redacted]d by LMP presenting for SOL. She reports +FMs, No LOF, no VB, no blurry vision, headaches or peripheral edema, and RUQ pain.  She plans on breast feeding. She's undecided for birth control. She received her prenatal care at  [facility]    Dating: By LMP --->  Estimated Date of Delivery: 06/12/23  Sono:    @[redacted]w[redacted]d , CWD, normal anatomy, breech presentation, posterior placental lie, 1080g, 39% EFW   Prenatal History/Complications:  Patient Active Problem List   Diagnosis Date Noted   Normal labor 06/14/2023   Supervision of other normal pregnancy, antepartum 02/01/2023   Late prenatal care affecting pregnancy 02/01/2023   History of gestational diabetes in prior pregnancy, currently pregnant 05/18/2021   NURSING  PROVIDER  Office Location [facility] Dating by TBD  [facility] Model Traditional Anatomy U/S   Initiated care at  [facility]                Language  English              LAB RESULTS   Support Person  Genetics NIPS:  AFP:     NT/IT (FT only)     Carrier Screen Horizon: Negative 4/4  Rhogam  O/Negative/-- (12/04 0845) A1C/GTT Early: (@23 .1 wks) Normal 72-53-66 Third trimester: defer  Flu Vaccine     TDaP Vaccine   Blood Type O/Negative/-- (12/04 0845)  Covid Vaccine  Antibody Negative (12/04 0845)  RSV Vaccine  Rubella 3.89 (12/04 0845) IMMUNE  Feeding Plan breast RPR Non Reactive (12/04 0845)  Contraception undecided HBsAg Negative (12/04 0845)  Circumcision Yes if female HIV Non Reactive (12/04 0845)  Pediatrician   [place] HCVAb Non Reactive (12/04 0845)  Prenatal Classes     BTL Consent  Pap No results found for: "DIAGPAP"  BTL Pre-payment  GC/CT Initial:   36wks:    VBAC Consent N/A GBS   For PCN allergy, check sensitivities        DME Rx [X]  BP cuff [ ]  Weight Scale Waterbirth  [ ]  Class [ ]  Consent [ ]  CNM visit  PHQ9 & GAD7 [X]  new OB [  ] 28 weeks  [  ] 36 weeks Induction  [ ]   Orders Entered [ ] Foley Y/N     Past Medical History: Past Medical History:  Diagnosis Date   Gestational diabetes     Past Surgical History: Past Surgical History:  Procedure Laterality Date   NO PAST SURGERIES      Obstetrical History: OB History     Gravida  3   Para  2   Term  2   Preterm      AB  0   Living  2      SAB  0   IAB  0   Ectopic  0   Multiple  0   Live Births  2           Social History Social History   Socioeconomic History   Marital status: Married    Spouse name: [place]   Number of children: 1   Years of education: Not on file   Highest education level: Not on file  Occupational History   Not on file  Tobacco Use   Smoking status: Never   Smokeless tobacco: Never  Vaping Use   Vaping status: Never Used  Substance and Sexual Activity   Alcohol use: Never   Drug  use: Never   Sexual activity: Yes    Birth control/protection: None  Other Topics Concern   Not on file  Social History Narrative   Not on file   Social Drivers of Health   Financial Resource Strain: Not on file  Food Insecurity: Not on file  Transportation Needs: Not on file  Physical Activity: Not on file  Stress: Not on file  Social Connections: Not on file    Family History: Family History  Problem Relation Age of Onset   Cancer Maternal Grandmother    Diabetes Maternal Grandfather    Asthma Neg Hx    Heart disease Neg Hx    Hypertension Neg Hx    Stroke Neg Hx     Allergies: No Known Allergies  Medications Prior to Admission  Medication Sig Dispense Refill Last Dose/Taking   Prenatal Vit-Fe Fumarate-FA (PRENATAL MULTIVITAMIN) TABS tablet Take 1 tablet by mouth daily after breakfast.        Review of Systems   All systems reviewed and negative except as stated in HPI  Blood pressure 118/63, pulse 90, temperature 97.6 F (36.4 C), temperature source Oral, resp. rate 16, height 5\' 5"  (1.651 m), weight 76.2 kg, last menstrual period  09/05/2022, SpO2 100%, unknown if currently breastfeeding. General appearance: alert, cooperative, and appears stated age Lungs: clear to auscultation bilaterally Heart: regular rate and rhythm Abdomen: soft, non-tender; bowel sounds normal Pelvic: normal female genitalia Extremities: Homans sign is negative, no sign of DVT Presentation: cephalic Fetal monitoringBaseline: 120 bpm, Variability: Good {> 6 bpm), Accelerations: Reactive, and Decelerations: Absent Uterine activity q58min Dilation: 7 Effacement (%): 90 Station: -1 Exam by:: Ginnie Smart RN   Prenatal labs: ABO, Rh: O/Negative/-- (12/04 0845) Antibody: Negative (12/04 0845) Rubella: 3.89 (12/04 0845) RPR: Non Reactive (12/04 0845)  HBsAg: Negative (12/04 0845)  HIV: Non Reactive (12/04 0845)  GBS:      Lab Results  Component Value Date   GBS NEGATIVE 08/24/2021   GTT normal Genetic screening  LR female, Horizon 4/4 neg,  Anatomy US normal  There is no immunization history for the selected administration types on file for this patient.  Prenatal Transfer Tool  Maternal Diabetes: No Genetic Screening: Normal Maternal Ultrasounds/Referrals: Normal Fetal Ultrasounds or other Referrals:  None Maternal Substance Abuse:  No Significant Maternal Medications:  None Significant Maternal Lab Results: GBS unknown, refused PCR Number of Prenatal Visits:Less than or equal to 3 verified prenatal visits, Late to [facility] and then Limited PNC (only one visit at 23wks)  Maternal Vaccinations:declined Other Comments:   Late/Limited PNC, only once visit at 23wks   Results for orders placed or performed during the hospital encounter of 06/13/23 (from the past 24 hours)  CBC   Collection Time: 06/14/23 12:30 AM  Result Value Ref Range   WBC 10.7 (H) 4.0 - 10.5 K/uL   RBC 3.70 (L) 3.87 - 5.11 MIL/uL   Hemoglobin 10.1 (L) 12.0 - 15.0 g/dL   HCT 78.2 (L) 95.6 - 21.3 %   MCV 84.6 80.0 - 100.0 fL   MCH 27.3 26.0 - 34.0 pg   MCHC 32.3  30.0 - 36.0 g/dL   RDW 08.6 57.8 - 46.9 %   Platelets 213 150 - 400 K/uL   nRBC 0.0 0.0 - 0.2 %    Patient Active Problem List   Diagnosis Date Noted   Normal labor 06/14/2023   Supervision of other normal pregnancy, antepartum 02/01/2023   Late prenatal care affecting pregnancy 02/01/2023  History of gestational diabetes in prior pregnancy, currently pregnant 05/18/2021    Assessment/Plan:  Loann Chahal is a 28 y.o. G3P2002 at [redacted]w[redacted]d here for SOL  #Labor: expectant management #Pain: Per patient request #FWB: Cat1 #GBS status:  Unknown, pt declines GBS PCR #Feeding: Breastmilk  #Reproductive Life planning: Undecided  #Late / Limited PNC: only one visit at 23 weeks, @[redacted]w[redacted]d , CWD, normal anatomy, breech presentation, posterior placental lie, 1080g, 39% EFW - SW consult PP   #Rh neg:  - Rhogam PP  Hessie Dibble, MD  06/14/2023, 2:58 AM

## 2023-06-15 ENCOUNTER — Other Ambulatory Visit (HOSPITAL_COMMUNITY): Payer: Self-pay

## 2023-06-15 LAB — RH IG WORKUP (INCLUDES ABO/RH)
Fetal Screen: NEGATIVE
Gestational Age(Wks): 40.2
Unit division: 0

## 2023-06-15 MED ORDER — ACETAMINOPHEN 325 MG PO TABS
650.0000 mg | ORAL_TABLET | ORAL | 0 refills | Status: AC | PRN
  Filled 2023-06-15: qty 20, 2d supply, fill #0

## 2023-06-15 MED ORDER — ACETAMINOPHEN 160 MG/5ML PO SOLN: 650.0000 mg | ORAL | Status: DC | PRN

## 2023-06-15 MED ORDER — IBUPROFEN 100 MG/5ML PO SUSP
600.0000 mg | Freq: Four times a day (QID) | ORAL | Status: DC
  Administered 2023-06-15: 600 mg via ORAL
  Filled 2023-06-15: qty 30

## 2023-06-15 MED ORDER — FERROUS SULFATE 325 (65 FE) MG PO TABS
325.0000 mg | ORAL_TABLET | ORAL | Status: DC
  Administered 2023-06-15: 325 mg via ORAL
  Filled 2023-06-15: qty 1

## 2023-06-15 MED ORDER — COMPLETENATE 29-1 MG PO CHEW
1.0000 | CHEWABLE_TABLET | Freq: Every day | ORAL | Status: DC
  Administered 2023-06-15: 1 via ORAL
  Filled 2023-06-15: qty 1

## 2023-06-15 MED ORDER — IBUPROFEN 600 MG PO TABS
600.0000 mg | ORAL_TABLET | Freq: Four times a day (QID) | ORAL | 0 refills | Status: AC
  Filled 2023-06-15: qty 30, 8d supply, fill #0

## 2023-06-15 MED ORDER — FERROUS SULFATE 325 (65 FE) MG PO TABS
325.0000 mg | ORAL_TABLET | ORAL | 0 refills | Status: AC
  Filled 2023-06-15: qty 15, 30d supply, fill #0

## 2023-06-15 MED ORDER — ACETAMINOPHEN 160 MG/5ML PO SUSP
650.0000 mg | ORAL | 0 refills | Status: AC | PRN
  Filled 2023-06-15: qty 118, 1d supply, fill #0

## 2023-06-15 NOTE — Clinical Social Work Maternal (Addendum)
 CLINICAL SOCIAL WORK MATERNAL/CHILD NOTE  Patient Details  Name: Sheryl Lopez MRN: [id number] Date of Birth: 06-18-1995  Date:  06/15/2023  Clinical Social Worker Initiating Note:  Sheryl Lopez Date/Time: Initiated:  06/15/23/1353     Child's Name:  Sheryl Lopez   Biological Parents:  Mother, Father Sheryl Lopez 12-23-94, Sheryl Lopez 09/16/1992)   Need for Interpreter:  None   Reason for Referral:  Late or No Prenatal Care     Address:  [address] [place] [address]    Phone number:  [phone] (home)     Additional phone number:   Household Members/Support Persons (HM/SP):   Household Member/Support Person 1, Household Member/Support Person 2, Household Member/Support Person 3   HM/SP Name Relationship DOB or Age  HM/SP -1 Sheryl Lopez spouse 09/16/1992  HM/SP -2 unknown son    HM/SP -3 unknown daughter    HM/SP -4        HM/SP -5        HM/SP -6        HM/SP -7        HM/SP -8          Natural Supports (not living in the home):  Higher education careers adviser Supports: None   Employment: Unemployed   Type of Work:     Education:      Homebound arranged:    Surveyor, quantity Resources:  Medicaid   Other Resources:      Cultural/Religious Considerations Which May Impact Care:    Strengths:  Ability to meet basic needs  , Home prepared for child  , Pediatrician chosen   Psychotropic Medications:         Pediatrician:    [place] (including Copywriter, advertising and surronding areas)  Pediatrician List:   [employer]    [place] [facility]  [facility]      Pediatrician Fax Number:    Risk Factors/Current Problems:  None   Cognitive State:  Able to Concentrate  , Alert     Mood/Affect:  Calm  , Comfortable     CSW Assessment: CSW received a consult for lack of prenatal care. CSW met with MOB to complete assessment and offer support. CSW entered the room and observed  MOB resting in bed and the infant in the bassinet. CSW introduced self, CSW role and reason for visit. MOB was agreeable to visit. CSW inquired about how MOB was feeling, MOB reported good and was pleasant throughout the assessment. CSW confirmed MOB's demographic info, MOB verified the information on file was correct. CSW inquired about any MH hx, MOB denied MH concerns. CSW provided education regarding the baby blues period vs. perinatal mood disorders, discussed treatment and gave resources for mental health follow up if concerns arise.  CSW recommends self-evaluation during the postpartum time period using the New Mom Checklist from Postpartum Progress and encouraged MOB to contact a medical professional if symptoms are noted at any time.  MOB identified FOB and church family as her primary supports.  CSW inquired about MOB's lack of prenatal care, MOB reported she did not feel the needs to go, MOB reported she pays attention her body and she felt good and felt the baby was doing fine. MOB also reported with lack of family support in her area childcare was hard to come by. MOB also expressed her OB office was a far drive. CSW verbalized understanding. CSW explained  the hospital drug screen policy due to lack of prenatal care. MOB verbalized understanding. CSW informed MOB infant UDS and CDS would be collected, MOB verbalized understanding. MOB denied substance use during pregnancy.  CSW provided review of Sudden Infant Death Syndrome (SIDS) precautions.  MOB reported they have all necessary items for infant including a bassinet and car seat. CSW identifies no further need for intervention and no barriers to discharge at this time.  CSW Plan/Description:  No Further Intervention Required/No Barriers to Discharge, Sudden Infant Death Syndrome (SIDS) Education, Perinatal Mood and Anxiety Disorder (PMADs) Education, Hospital Drug Screen Policy Information, CSW Will Continue to Monitor Umbilical Cord Tissue  Drug Screen Results and Make Report if Sheryl Salaam, LCSW 06/15/2023, 2:02 PM

## 2023-06-15 NOTE — Progress Notes (Signed)
 Post Partum Day 1 Subjective: no complaints, up ad lib, voiding and tolerating PO, small lochia, plans to breastfeed, plans to bottle feed, no method  Objective: Blood pressure 116/72, pulse 84, temperature 98.5 F (36.9 C), temperature source Oral, resp. rate 16, height 5\' 5"  (1.651 m), weight 76.2 kg, last menstrual period 09/05/2022, SpO2 100%, unknown if currently breastfeeding.  Physical Exam:  General: alert, cooperative and no distress Lochia:normal flow Chest: CTAB Heart: RRR no m/r/g Abdomen: +BS, soft, nontender,  Uterine Fundus: firm DVT Evaluation: No evidence of DVT seen on physical exam. Extremities: no edema  Recent Labs    06/14/23 0030 06/14/23 0615  HGB 10.1* 9.2*  HCT 31.3* 27.9*    Assessment/Plan: Plan for discharge tomorrow, Breastfeeding, Lactation consult, and Social Work consult   LOS: 1 day   Jacklyn Shell 06/15/2023, 7:32 AM

## 2023-06-17 IMAGING — US US MFM OB DETAIL+14 WK
1 series · 13 of 28 positions shown · non-contrast
Comparison: none

[Series 1: us mfm ob detail+14 wk · 121 acquisitions, 13 frames shown]
[im 5/121]
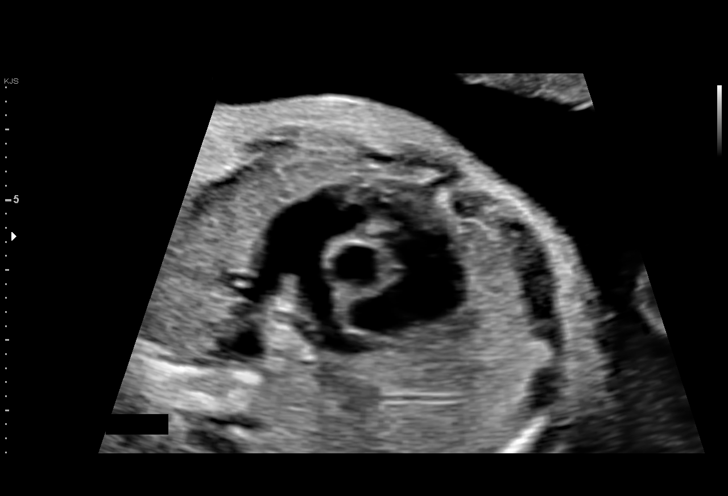
[im 14/121]
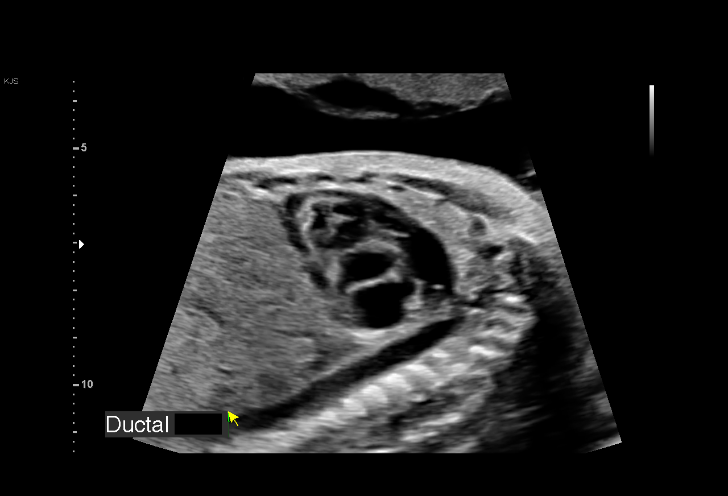
[im 23/121]
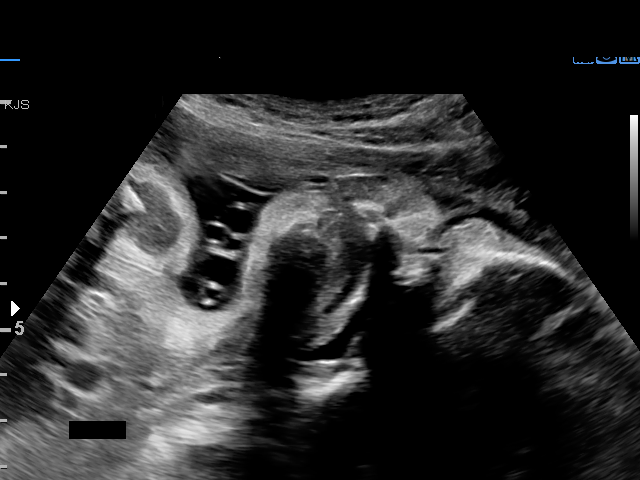
[im 32/121]
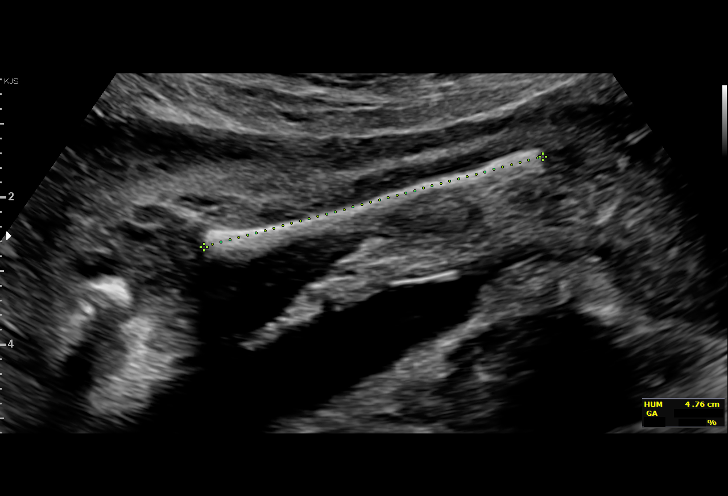
[im 41/121]
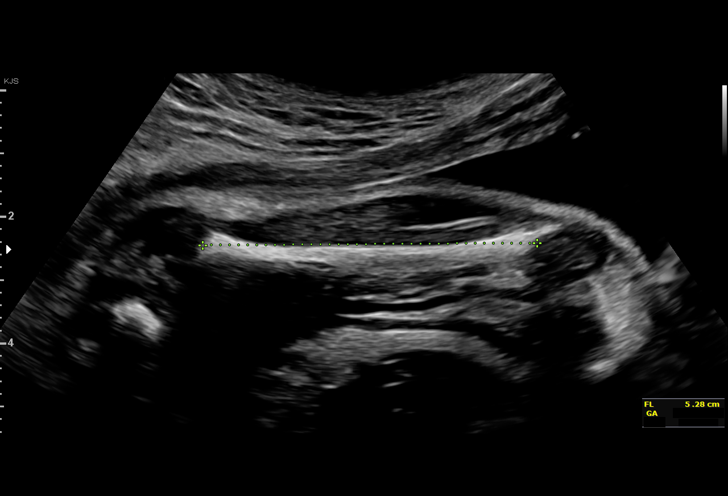
[im 49/121]
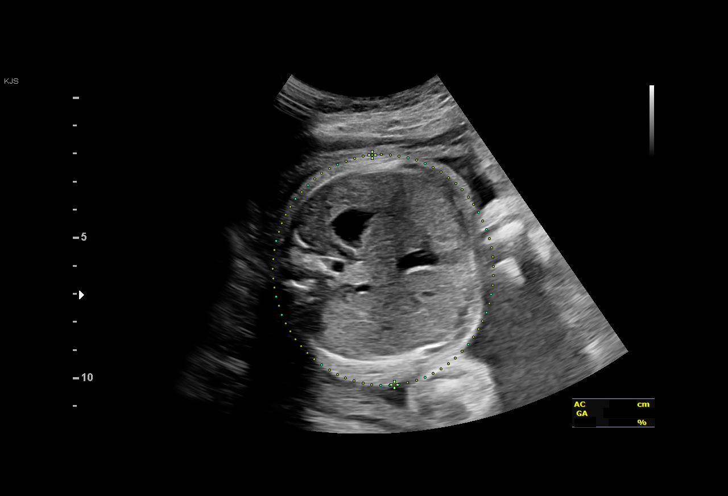
[im 63/121]
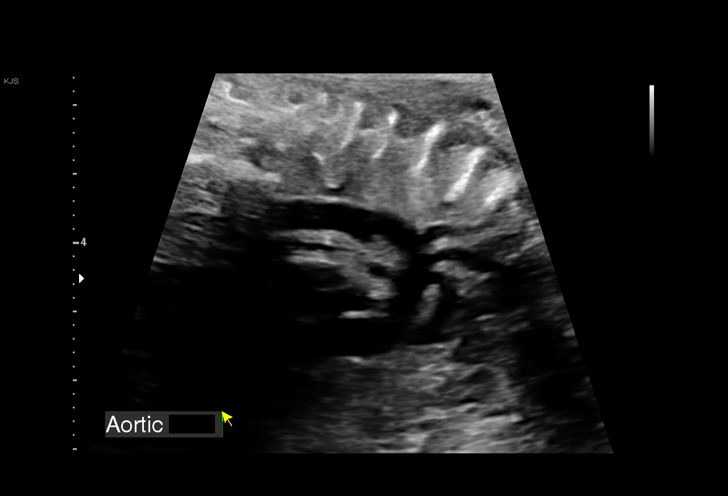
[im 72/121]
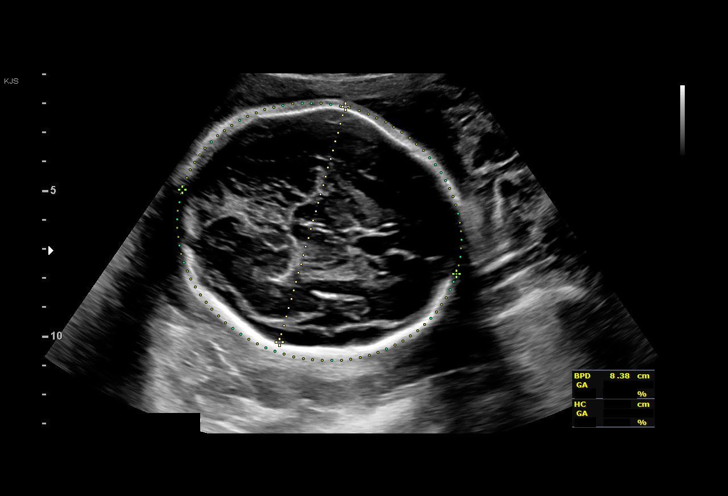
[im 81/121]
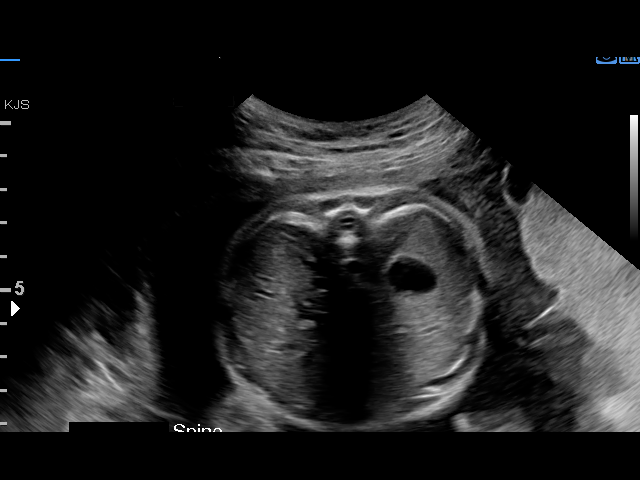
[im 89/121]
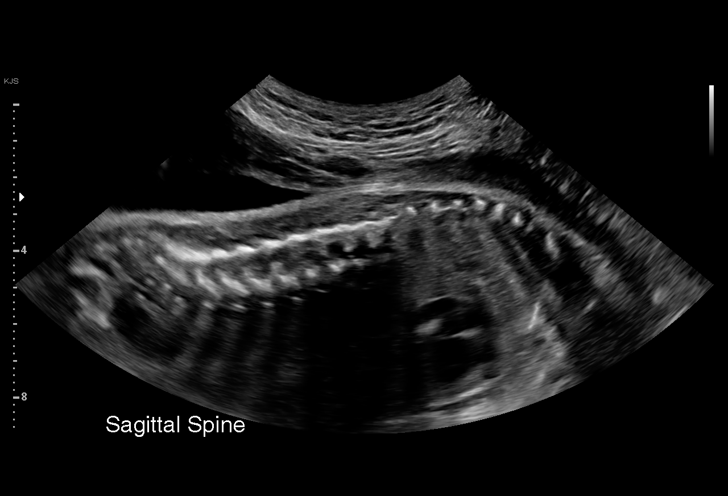
[im 98/121]
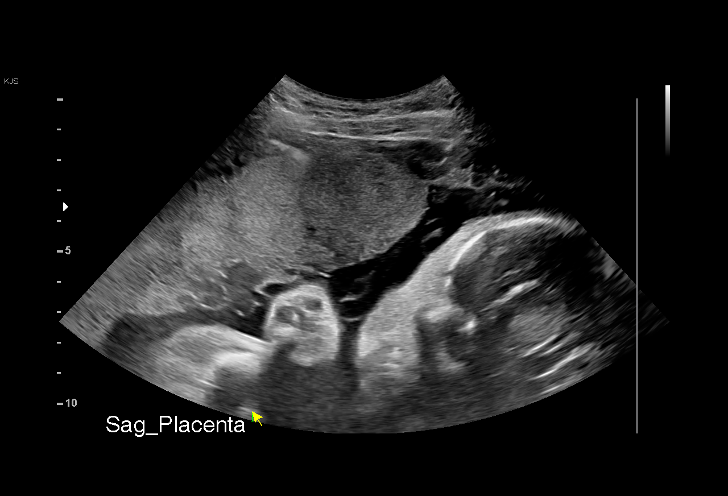
[im 107/121]
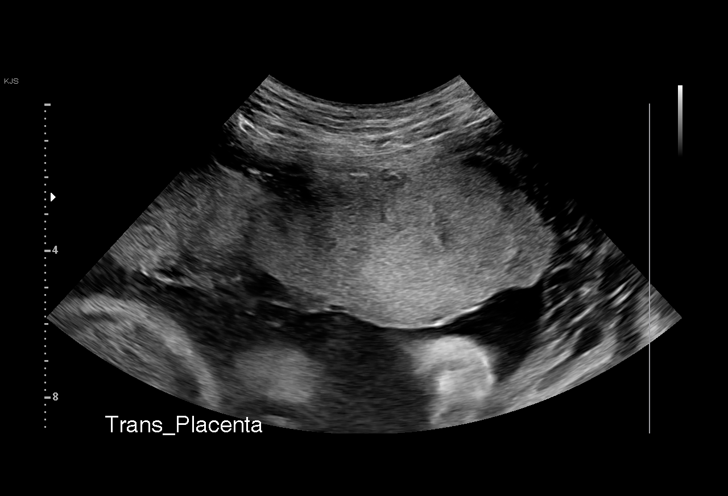
[im 116/121]
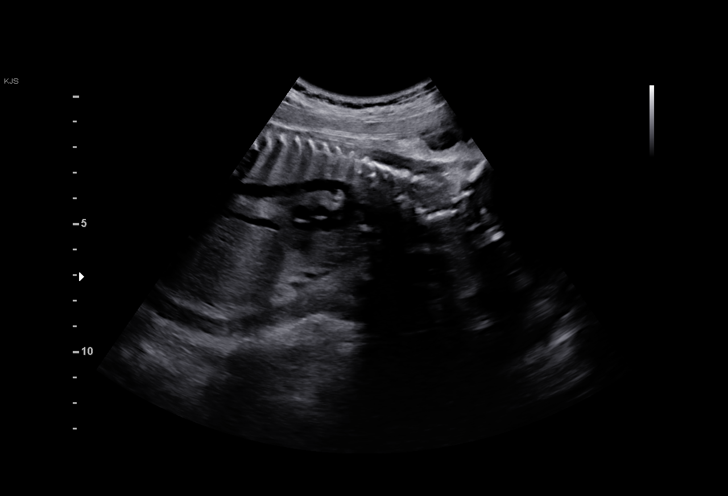

[13 of 28 positions shown; findings below may reference images not displayed]

Indications

 Late to prenatal care, third trimester
 Encounter for antenatal screening for
 malformations
 28 weeks gestation of pregnancy
 Declined genetic screening
Fetal Evaluation

 Num Of Fetuses:         1
 Fetal Heart Rate(bpm):  158
 Cardiac Activity:       Observed
 Presentation:           Cephalic
 Placenta:               Anterior
 P. Cord Insertion:      Visualized, central

 Amniotic Fluid
 AFI FV:      Within normal limits

 AFI Sum(cm)     %Tile       Largest Pocket(cm)
 16.7            62

 RUQ(cm)       RLQ(cm)       LUQ(cm)        LLQ(cm)

Biometry
 BPD:      83.9  mm     G. Age:  33w 5d       > 99  %    CI:        81.73   %    70 - 86
                                                         FL/HC:      18.0   %    19.6 -
 HC:      292.9  mm     G. Age:  32w 2d         97  %    HC/AC:      1.16        0.99 -
 AC:      253.4  mm     G. Age:  29w 4d         64  %    FL/BPD:     62.7   %    71 - 87
 FL:       52.6  mm     G. Age:  28w 0d         14  %    FL/AC:      20.8   %    20 - 24
 HUM:      47.2  mm     G. Age:  27w 5d         22  %
 CER:        37  mm     G. Age:  30w 4d         92  %

 LV:        5.2  mm
 CM:        7.7  mm

 Est. FW:    9993  gm      3 lb 2 oz     65  %
OB History

 Gravidity:    2         Term:   1        Prem:   0        SAB:   0
 TOP:          0       Ectopic:  0        Living: 1
Gestational Age

 LMP:           28w 6d        Date:  11/18/20                 EDD:   08/25/21
 U/S Today:     30w 6d                                        EDD:   08/11/21
 Best:          28w 6d     Det. By:  LMP  (11/18/20)          EDD:   08/25/21
Anatomy

 Cranium:               Appears normal         LVOT:                   Appears normal
 Cavum:                 Appears normal         Aortic Arch:            Appears normal
 Ventricles:            Appears normal         Ductal Arch:            Appears normal
 Choroid Plexus:        Appears normal         Diaphragm:              Appears normal
 Cerebellum:            Appears normal         Stomach:                Appears normal, left
                                                                       sided
 Posterior Fossa:       Appears normal         Abdomen:                Appears normal
 Nuchal Fold:           Not applicable (>20    Abdominal Wall:         Appears nml (cord
                        wks GA)                                        insert, abd wall)
 Face:                  Appears normal         Cord Vessels:           Appears normal (3
                        (orbits and profile)                           vessel cord)
 Lips:                  Appears normal         Kidneys:                Appear normal
 Palate:                Appears normal         Bladder:                Appears normal
 Thoracic:              Appears normal         Spine:                  Limited views
                                                                       appear normal
 Heart:                 Appears normal         Upper Extremities:      Appears normal
                        (4CH, axis, and
                        situs)
 RVOT:                  Appears normal         Lower Extremities:      Appears normal

 Other:  Fetus appears to be a male. Nasal bone, lenses, open hands, feet,
         VC, 3VV and 3VTV visualized. Technically difficult due to advanced
         gestational age.
Cervix Uterus Adnexa

 Cervix
 Not visualized (advanced GA >96wks)
Comments

 This patient was seen for a detailed fetal anatomy scan as
 she presented late for prenatal care.
 She denies any significant past medical history and denies
 any problems in her current pregnancy.
 As she presented late for prenatal care, she has not had any
 screening tests for fetal aneuploidy drawn in her current
 pregnancy.
 She was informed that the fetal growth and amniotic fluid
 level were appropriate for her gestational age.  The fetal
 biometry measurements obtained today are consistent with
 an EDC August 25, 2021.
 There were no obvious fetal anomalies noted on today's
 ultrasound exam.  However, today's exam was limited due to
 her advanced gestational age.
 The patient was informed that anomalies may be missed due
 to technical limitations. If the fetus is in a suboptimal position
 or maternal habitus is increased, visualization of the fetus in
 the maternal uterus may be impaired.
 The patient was offered and declined a screening test for
 fetal aneuploidy today.
 A follow-up exam was scheduled in 4 weeks to confirm her
 dates.

## 2023-06-25 ENCOUNTER — Telehealth (HOSPITAL_COMMUNITY): Payer: Self-pay | Admitting: *Deleted

## 2023-06-25 NOTE — Telephone Encounter (Signed)
 Attempted hospital discharge follow-up call. Left message for patient to return RN call with any questions or concerns. Julien Odor, RN, 06/25/23, [phone]

## 2023-07-12 ENCOUNTER — Ambulatory Visit: Admitting: Obstetrics and Gynecology
